# Patient Record
Sex: Female | Born: 1980 | Race: Black or African American | Hispanic: No | Marital: Married | Smoking: Current every day smoker
Health system: Southern US, Community
[De-identification: ages and names within clinical notes are randomized; demographics above are authoritative.]

## PROBLEM LIST (undated history)

## (undated) DIAGNOSIS — F329 Major depressive disorder, single episode, unspecified: Secondary | ICD-10-CM

## (undated) DIAGNOSIS — F419 Anxiety disorder, unspecified: Secondary | ICD-10-CM

## (undated) DIAGNOSIS — D649 Anemia, unspecified: Secondary | ICD-10-CM

## (undated) DIAGNOSIS — F32A Depression, unspecified: Secondary | ICD-10-CM

## (undated) DIAGNOSIS — M1990 Unspecified osteoarthritis, unspecified site: Secondary | ICD-10-CM

## (undated) HISTORY — PX: TUBAL LIGATION: SHX77

---

## 2004-04-08 ENCOUNTER — Emergency Department (HOSPITAL_COMMUNITY): Admission: EM | Admit: 2004-04-08 | Discharge: 2004-04-08 | Payer: Self-pay | Admitting: Emergency Medicine

## 2005-11-22 ENCOUNTER — Emergency Department (HOSPITAL_COMMUNITY): Admission: EM | Admit: 2005-11-22 | Discharge: 2005-11-22 | Payer: Self-pay | Admitting: Emergency Medicine

## 2005-11-29 ENCOUNTER — Inpatient Hospital Stay (HOSPITAL_COMMUNITY): Admission: EM | Admit: 2005-11-29 | Discharge: 2005-11-30 | Payer: Self-pay | Admitting: Emergency Medicine

## 2006-01-04 ENCOUNTER — Ambulatory Visit (HOSPITAL_COMMUNITY): Payer: Self-pay | Admitting: Psychiatry

## 2006-01-16 ENCOUNTER — Ambulatory Visit (HOSPITAL_COMMUNITY): Payer: Self-pay | Admitting: Psychiatry

## 2006-02-09 ENCOUNTER — Emergency Department (HOSPITAL_COMMUNITY): Admission: EM | Admit: 2006-02-09 | Discharge: 2006-02-09 | Payer: Self-pay | Admitting: Emergency Medicine

## 2006-02-18 ENCOUNTER — Ambulatory Visit (HOSPITAL_COMMUNITY): Payer: Self-pay | Admitting: Psychiatry

## 2006-03-20 ENCOUNTER — Ambulatory Visit (HOSPITAL_COMMUNITY): Payer: Self-pay | Admitting: Psychiatry

## 2006-04-17 ENCOUNTER — Ambulatory Visit (HOSPITAL_COMMUNITY): Admission: RE | Admit: 2006-04-17 | Discharge: 2006-04-17 | Payer: Self-pay | Admitting: Family Medicine

## 2006-05-01 ENCOUNTER — Emergency Department (HOSPITAL_COMMUNITY): Admission: EM | Admit: 2006-05-01 | Discharge: 2006-05-01 | Payer: Self-pay | Admitting: Emergency Medicine

## 2006-05-02 ENCOUNTER — Inpatient Hospital Stay (HOSPITAL_COMMUNITY): Admission: AD | Admit: 2006-05-02 | Discharge: 2006-05-02 | Payer: Self-pay | Admitting: Family Medicine

## 2006-05-02 ENCOUNTER — Ambulatory Visit: Payer: Self-pay | Admitting: *Deleted

## 2006-05-06 ENCOUNTER — Inpatient Hospital Stay (HOSPITAL_COMMUNITY): Admission: AD | Admit: 2006-05-06 | Discharge: 2006-05-06 | Payer: Self-pay | Admitting: Obstetrics & Gynecology

## 2006-05-06 ENCOUNTER — Ambulatory Visit: Payer: Self-pay | Admitting: Obstetrics and Gynecology

## 2006-05-15 ENCOUNTER — Ambulatory Visit (HOSPITAL_COMMUNITY): Payer: Self-pay | Admitting: Psychiatry

## 2006-06-10 ENCOUNTER — Ambulatory Visit (HOSPITAL_COMMUNITY): Payer: Self-pay | Admitting: Psychiatry

## 2006-07-24 ENCOUNTER — Ambulatory Visit (HOSPITAL_COMMUNITY): Payer: Self-pay | Admitting: Psychiatry

## 2006-08-28 ENCOUNTER — Ambulatory Visit (HOSPITAL_COMMUNITY): Payer: Self-pay | Admitting: Psychiatry

## 2006-09-03 ENCOUNTER — Inpatient Hospital Stay (HOSPITAL_COMMUNITY): Admission: AD | Admit: 2006-09-03 | Discharge: 2006-09-05 | Payer: Self-pay | Admitting: Obstetrics & Gynecology

## 2006-09-03 ENCOUNTER — Ambulatory Visit: Payer: Self-pay | Admitting: Obstetrics & Gynecology

## 2006-09-09 ENCOUNTER — Inpatient Hospital Stay (HOSPITAL_COMMUNITY): Admission: AD | Admit: 2006-09-09 | Discharge: 2006-09-09 | Payer: Self-pay | Admitting: Gynecology

## 2006-11-01 ENCOUNTER — Ambulatory Visit (HOSPITAL_COMMUNITY): Payer: Self-pay | Admitting: Psychiatry

## 2006-11-27 ENCOUNTER — Ambulatory Visit (HOSPITAL_COMMUNITY): Payer: Self-pay | Admitting: Psychiatry

## 2008-08-28 ENCOUNTER — Emergency Department (HOSPITAL_COMMUNITY): Admission: EM | Admit: 2008-08-28 | Discharge: 2008-08-28 | Payer: Self-pay | Admitting: Emergency Medicine

## 2009-12-20 ENCOUNTER — Emergency Department (HOSPITAL_COMMUNITY): Admission: EM | Admit: 2009-12-20 | Discharge: 2009-12-20 | Payer: Self-pay | Admitting: Emergency Medicine

## 2009-12-24 ENCOUNTER — Emergency Department (HOSPITAL_COMMUNITY): Admission: EM | Admit: 2009-12-24 | Discharge: 2009-12-24 | Payer: Self-pay | Admitting: Emergency Medicine

## 2009-12-30 ENCOUNTER — Emergency Department (HOSPITAL_COMMUNITY): Admission: EM | Admit: 2009-12-30 | Discharge: 2009-12-30 | Payer: Self-pay | Admitting: Family Medicine

## 2010-01-08 ENCOUNTER — Emergency Department (HOSPITAL_COMMUNITY)
Admission: EM | Admit: 2010-01-08 | Discharge: 2010-01-09 | Payer: Self-pay | Source: Home / Self Care | Admitting: Emergency Medicine

## 2010-04-18 LAB — CBC
Hemoglobin: 14.1 g/dL (ref 12.0–15.0)
MCH: 34.6 pg — ABNORMAL HIGH (ref 26.0–34.0)
MCV: 98.8 fL (ref 78.0–100.0)
RBC: 4.08 MIL/uL (ref 3.87–5.11)

## 2010-04-18 LAB — BASIC METABOLIC PANEL
CO2: 26 mEq/L (ref 19–32)
Calcium: 9 mg/dL (ref 8.4–10.5)
Chloride: 102 mEq/L (ref 96–112)
GFR calc Af Amer: >60 mL/min (ref >60)
Sodium: 139 mEq/L (ref 135–145)

## 2010-04-18 LAB — RAPID URINE DRUG SCREEN, HOSP PERFORMED
Barbiturates: NOT DETECTED
Opiates: NOT DETECTED

## 2010-04-18 LAB — DIFFERENTIAL
Eosinophils Absolute: 0.1 10*3/uL (ref 0.0–0.7)
Lymphocytes Relative: 12 % (ref 12–46)
Lymphs Abs: 1.4 10*3/uL (ref 0.7–4.0)
Monocytes Relative: 7 % (ref 3–12)
Neutrophils Relative %: 81 % — ABNORMAL HIGH (ref 43–77)

## 2010-05-26 ENCOUNTER — Inpatient Hospital Stay (HOSPITAL_COMMUNITY)
Admission: AD | Admit: 2010-05-26 | Discharge: 2010-05-26 | Discharge status: Against Medical Advice | Payer: Self-pay | Source: Ambulatory Visit | Attending: Obstetrics and Gynecology | Admitting: Obstetrics and Gynecology

## 2010-05-26 DIAGNOSIS — R109 Unspecified abdominal pain: Secondary | ICD-10-CM | POA: Insufficient documentation

## 2010-05-26 LAB — URINALYSIS, ROUTINE W REFLEX MICROSCOPIC
Glucose, UA: NEGATIVE mg/dL
Ketones, ur: NEGATIVE mg/dL
Leukocytes, UA: NEGATIVE
Specific Gravity, Urine: 1.01 (ref 1.005–1.030)
pH: 5 (ref 5.0–8.0)

## 2010-05-26 LAB — URINE MICROSCOPIC-ADD ON

## 2010-06-20 NOTE — Op Note (Signed)
[name], [name]           ACCOUNT NO.:  [id number]   MEDICAL RECORD NO.:  [id number]          PATIENT TYPE:  INP   LOCATION:  9120                          FACILITY:  [facility]   PHYSICIAN:  Allie Bossier, MD        DATE OF BIRTH:  06-Feb-1980   DATE OF PROCEDURE:  09/04/2006  DATE OF DISCHARGE:                               OPERATIVE REPORT   PREOPERATIVE DIAGNOSES:  Multiparity, desires sterility.   POSTOPERATIVE DIAGNOSES:  Multiparity, desires sterility.   PROCEDURE:  Application of Filshie clips for sterilization procedure.   SURGEON:  Allie Bossier, M.D.   ANESTHESIA:  Spinal, Raul Del, M.D.   COMPLICATIONS:  None.   ESTIMATED BLOOD LOSS:  Minimal.   SPECIMENS:  None.   FINDINGS:  Normal-appearing oviducts.   DETAILED PROCEDURE AND FINDINGS:  The risks, benefits, and alternatives  of surgery were explained, understood and accepted.  She understands and  was able to verbalize a 1% failure rate of this procedure and declined  alternative forms of birth control.  Her epidural was bolused for  surgery.  She was taken to the operating room and her abdomen was  prepped and draped in the usual sterile fashion.  After adequate  anesthesia was assured, approximately 10 mL of 0.5% Marcaine was  injected into the umbilical area.  A vertical incision was made at this  site.  The peritoneum was entered with hemostats.  Using [employer]  retractors, the oviduct on the patient's left was visualized.  It was  grasped with a Babcock clamp and traced to the fimbriated end.  It was  retraced to the isthmic region, where a Filshie clip was placed.  Approximately 1 mL of 0.5% Marcaine was placed next to the clip in the  tube for postoperative pain management.  Excellent hemostasis was noted.  A repeat procedure was performed on the left.  Again, the isthmic region  was ligated with Filshie clip.   The fascia was elevated and closed with a 0 Vicryl running, nonlocking  suture.   Subcuticular closure was done with 4-0 Vicryl.  Instrument,  sponge and needle counts were correct.  She was taken to the recovery  room in stable condition.     Allie Bossier, MD  Electronically Signed    MCD/MEDQ  D:  09/04/2006  T:  09/04/2006  Job:  [id number]

## 2010-06-23 NOTE — Consult Note (Signed)
[name], [name] NO.:  [id number]   MEDICAL RECORD NO.:  [id number]          PATIENT TYPE:  INP   LOCATION:  2008                         FACILITY:  [facility]   PHYSICIAN:  Antonietta Breach, M.D.  DATE OF BIRTH:  1981-01-15   DATE OF CONSULTATION:  11/30/2005  DATE OF DISCHARGE:  11/30/2005                                   CONSULTATION   REQUESTING PHYSICIAN:  Hillery Aldo, M.D.   REASON FOR CONSULTATION:  Psychosis, confusion, and dissociation.   [name] is a 30 year old female admitted to the [facility]  on November 29, 2005, after being found out in the rain with bizarre  behavior.  The patient was having slight auditory hallucinations and had  been believing that someone was following her and stalking her.  She had  lost a recollection of who her close relatives were and a recollection for  recent days.  The patient had recently gotten some distressing news about  her brother being murdered.  She had been required to testify in court.  The  patient drinks a 24-ounce beer daily.  She states she does not normally  finish it.  She smokes marijuana on a regular basis and she states there is  a possibility something was put in it.  She provides this information after  recovering from her amnestic dissociative symptoms.   The patient also is doing much better today with her orientation.  She has  no further hallucinations, delusions.  She has no thoughts of harming  herself or others.  She has normal interests and looks forward to getting  back home.  She is cooperative with staff.   PAST PSYCHIATRIC HISTORY:  The patient denies any prior history of  hallucinations or delusions.  She denies any history of amnestic periods.  She denies any history of psychiatric care, suicidal hospitalizations or  suicide attempts.  She has never been on any psychotropic medication.   FAMILY PSYCHIATRIC HISTORY:  She does not know of any.   SOCIAL HISTORY:   The patient is married.  She has 2 children.  She works at  [employer].  She resides with her husband.  She notes that her husband has  been unemployed recently and this has also been a stress.   SUBSTANCE ABUSE:  As above.   GENERAL MEDICAL PROBLEMS:  The patient has no general medical problems.   MEDICATIONS:  The MAR is reviewed.  1. The patient is placed on Zyprexa through recommendation by the      undersigned yesterday at 2.5 mg b.i.d. and 5 mg every day p.r.n.  2. She is on Ativan 1 mg t.i.d. and 1-2 mg q.6 h. p.r.n.   LABORATORY DATA:  The MCV is elevated at 105.5.  The platelet count is  normal.  BUN normal.  Creatinine normal.  The pregnancy test is negative.  The urine drug screen was positive for THC and benzodiazepines.  Blood  alcohol negative.  Head CT was negative.   THE PATIENT HAS NO KNOWN DRUG ALLERGIES.   REVIEW OF SYSTEMS:  CONSTITUTIONAL:  Afebrile.  EYES:  No visual  changes.  EARS:  No hearing impairment.  NOSE:  No rhinorrhea.  THROAT:  no sore  throat.  MOUTH:  Unremarkable.  NEUROLOGIC:  Unremarkable.  PSYCHIATRIC:  As  above.  CARDIOVASCULAR:  No chest pain, palpitations, or edema.  RESPIRATORY:  No coughing or wheezing.  GASTROINTESTINAL:  No nausea,  vomiting, or diarrhea.  GENITOURINARY:  No dysuria.  SKIN:  Unremarkable.  MUSCULOSKELETAL:  No deformities.  ENDOCRINE/METABOLIC:  Unremarkable.  HEMATOLOGIC/LYMPHATIC:  As above.   PHYSICAL EXAMINATION:  VITAL SIGNS:  Temperature 98, pulse 92, respirations  18, blood pressure 105/71, O2 saturation on room air 100%.  MENTAL STATUS:  [name] is alert.  She is oriented to all spheres.  She  is socially appropriate.  Her mood is slightly anxious.  Her affect is  mildly constricted at baseline with a broad appropriate response.  She has  intact memory for immediate recent and remote.  She does have recall also  for most of the period in which she was having the dissociative symptoms  yesterday.  She has  good insight into her psychotic symptoms and can clearly  recognize that she was hallucinating, that she was having a bizarre  experience similar to like a nightmare while awake.  She also mentions that  there is a possibility that something was placed in her marijuana, although  she states that it looked like the normal type that she uses.  She does  recognize that her substance use pattern can contribute to her dysfunction.  Thought process is logical, coherent and goal-directed.  No looseness of  association thought content.  No thoughts of harming herself, harming  others.  No delusions.  No hallucinations.  Her speech involves normal rate  and prosody.  Her eye contact is good.  Her psychomotor tone is within  normal limits.  Her judgment is now intact.  Her fund of knowledge and  intelligence are average.   DIAGNOSES:   AXIS I:  1. 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  2. Dissociative disorder, not otherwise specified, currently resolved.  3. Cannabis abuse, rule out alcohol abuse.   AXIS II:  Deferred.   AXIS III:  See general medical problems.   AXIS IV:  Primary support group.   AXIS V:  Fifty-five.   [name] is no longer at risk for self neglect.  She has recovered from  an acute psychotic as well as acute dissociative symptoms.   Because of the recent recovery and the benefits of further observation, the  undersigned recommended that she voluntarily enter a psychiatric inpatient  unit for further observation, however, the patient declined and she no  longer meets criteria for commitment give her stability today which has been  consistent at least for today.   While the patient declines inpatient, she is highly motivated for  outpatient.  She also agrees to the goal of abstinence from alcohol and  illegal drugs.   The patient has knowledge of emergency services contact number and agrees to call if she develops any thoughts of harming  herself, thoughts of harming  others, hallucinations, or distress.   INFORMED CONSENT:  The indications, alternatives, and adverse effects of  Zyprexa were discussed with the patient for anti-psychosis.  The patient  understands and would like to continue Zyprexa with the understanding that  any discussion of reduction in dosage or discontinuation needs to be done  according to the standard of care and under the guidance of her followup  doctor.  She is motivated for the intensive outpatient program at one of the  local hospitals.   The undersigned provided education and ego-supportive therapy.   RECOMMENDATIONS:  1. Would reduce the Ativan to 1 mg b.i.d., and then 1 mg every day the      subsequent day, and then stop.  Would provide Ativan 0.5 mg one p.o.      t.i.d. p.r.n. anxiety with no more than 20 dispensed.  This will      provide enough for the patient to enter the intensive outpatient      program with relief for any feeling on edge that she might have.  2. Would continue the Zyprexa q.h.s. for anti-psychosis.  Would also      provide a 10-day supply on prescription.  The patient will be entering      the intensive outpatient program where further prescribing can be done      in preparation for her eventual outpatient visits.  3. Twelve Step groups are recommended along with the chemical dependency      intensive outpatient program at either [facility], [facility], or [facility].  4. Psychotherapy will be conducted within one of those programs and      eventually individual psychotherapy is also recommended, emphasizing      coping skills and stress management.   DISCUSSION:  The patient's long term prognosis regarding these psychotic  episode is un-determined at this point.  The differential does include those  within the primary psychotic disorders as well as possible substance  induced.      Antonietta Breach, M.D.  Electronically  Signed     JW/MEDQ  D:  11/30/2005  T:  12/01/2005  Job:  [id number]

## 2010-06-23 NOTE — Discharge Summary (Signed)
[name], [name]           ACCOUNT NO.:  [id number]   MEDICAL RECORD NO.:  [id number]          PATIENT TYPE:  INP   LOCATION:  2008                         FACILITY:  [facility]   PHYSICIAN:  Hillery Aldo, M.D.   DATE OF BIRTH:  11-22-1980   DATE OF ADMISSION:  11/28/2005  DATE OF DISCHARGE:  11/30/2005                                 DISCHARGE SUMMARY   PRIMARY CARE PHYSICIAN:  The patient is unassigned.   DISCHARGE DIAGNOSES:  1. Psychosis, not otherwise specified.  2. Depression.  3. Anxiety.  4. Mild rhabdomyolysis.  5. Mild macrocytic anemia, B12 and folate within normal limits.   DISCHARGE MEDICATIONS:  1. Zyprexa 2.5 mg b.i.d. and 5 mg p.r.n.  2. Ativan 1 mg t.i.d.   CONSULTATIONS:  Antonietta Breach, M.D., of psychiatry.   PROCEDURES AND DIAGNOSTIC STUDIES:  CT of the head on November 28, 2005,  showed no intracranial abnormalities.   DISCHARGE LABORATORY DATA:  Sodium was 139, potassium 3.8, chloride 111,  bicarb 25, BUN 6, creatinine 0.6, glucose 91.  White blood cell count was  9.3, hemoglobin 11.1, hematocrit 33.2, platelet count 167.  B12 was 804,  folate 12.6, TSH 0.415.  RPR was nonreactive.  Total CK was 733.   BRIEF ADMISSION HISTORY OF PRESENT ILLNESS:  The patient is a 30 year old  female who was admitted for concerns about abnormal behavior.  Apparently,  the patient was found by her mother agitated and walking outside in the rain  partially dressed.  The patient apparently did not recognize the mother and  screamed uncontrollably and had to be forcibly taken to the emergency  department by EMS.  She was restrained physically.  She was admitted for  further evaluation and workup.   HOSPITAL COURSE BY PROBLEMS:  Problem 1.  PSYCHOTIC EPISODE:  The patient had an acute episode of  psychosis likely triggered by the stress of attending a recent court hearing  in which her brother's murderer was on trial.  According to the patient's  mother, she has had  difficulty in accepting the death of her brother.  She  had no prior past medical history of psychiatric disturbances.  She was  committed to the hospital and then seen in consultation by Dr. Jeanie Sewer and  Eduard Roux, NP. They recommended that the patient be commenced on Zyprexa  b.i.d. with 5 mg doses daily p.r.n.  She was also started on standing dose  of Ativan.  She has not exhibited any psychosis since her arrival on the  unit.  She has no memory or recollection of the events that triggered her  hospitalization.  She is alert and oriented x3.  Urine drug screen was only  positive for marijuana.  There were some benzodiazepines in her urine but  she was given Versed in the emergency department, which likely explains  this.  The patient has been calm and has not exhibited any homicidal or  suicidal behaviors.  She has been monitored by a 24-hour sitter.  She is  medically stable for transfer to a psychiatric facility.  The patient is  requesting to go home at the  present time, and was re- evaluated by Dr.  Jeanie Sewer to determine if she is still committable.  He felt that it was  safe to send her home with outpatient psychiatric follow-up.   Problem 2.  MILD RHABDOMYOLYSIS:  The patient had mild rhabdomyolysis as  evidenced by CK of 1458 on arrival.  With IV fluids, this came down to 733.  The patient is mildly symptomatic with some sore muscles in the shoulder  area, otherwise no complaints.   Problem 3.  MILD MACROCYTIC ANEMIA:  The patient does have a very mild  anemia.  Her B12 level and serum folate are within normal limits.   DISPOSITION:  The patient will be discharged home. She was committed  yesterday when she was acutely psychotic; however, she is in a normal state  of mind at present.  She will follow up at the mental health center.      Hillery Aldo, M.D.  Electronically Signed     CR/MEDQ  D:  11/30/2005  T:  11/30/2005  Job:  [id number]

## 2010-06-23 NOTE — H&P (Signed)
[name], [name]           ACCOUNT NO.:  [id number]   MEDICAL RECORD NO.:  [id number]          PATIENT TYPE:  INP   LOCATION:  2008                         FACILITY:  [facility]   PHYSICIAN:  Della Goo, M.D. DATE OF BIRTH:  1980/09/14   DATE OF ADMISSION:  11/28/2005  DATE OF DISCHARGE:                                HISTORY & PHYSICAL   CHIEF COMPLAINT:  Psychotic episode.   HISTORY OF PRESENT ILLNESS:  This is a 30 year old female who was brought to  the emergency room via EMS secondary to her family describing her as being  outside of her home today, in the rain, barking and yelling.  Patient was  reported as being out of control, such that the family called emergency  medical services and family members also took out involuntary commitment  papers.   Patient was evaluated in the emergency department by the ER physician and  behavioral health was consulted for evaluation.  Patient was medicated in  the emergency department, became more calm, however, was referred for  admission secondary to abnormal lab findings of an elevated CPK level.   PAST MEDICAL HISTORY:  The patient is a gravida 2, para 2.  Has no other  medical problems and is on no medications.  Has no known drug allergies.   SOCIAL HISTORY:  Patient denies tobacco usage, reports occasional alcohol,  denies drug usage.   FAMILY HISTORY:  Is non contributory.   REVIEW OF SYSTEMS:  Negative except for abdominal pain and other pertinent's  mentioned above.  Patient has had no fevers, chills, shortness of breath, or  chest pain.   PHYSICAL EXAMINATION:  30 year old well nourished, well developed female in  discomfort but no acute distress.  Her vital signs:  Temperature of 99.0,  blood pressure 107/71, heart rate 73, respirations 20, O2 saturation 100%.  HEENT:  Normocephalic, atraumatic.  No scleral icterus.  Pupils equally  round, reactive to light.  Extraocular muscles are intact.  Oropharynx is   clear.  NECK:  Is supple, full range of motion.  No thyromegaly, adenopathy, or  jugular venous distension.  CARDIOVASCULAR:  Regular rate, rhythm.  LUNGS:  Clear to auscultation bilaterally.  ABDOMEN:  Positive bowel sounds, soft, non tender.  EXTREMITIES:  No edema.  SKIN EXAMINATION:  No visible ecchymotic areas.  NEUROLOGIC EXAMINATION:  Patient is alert, mildly confused, speech is clear,  responses are appropriate at this time.  There are no focal deficits.   LABORATORY DATA:  Laboratory studies reveal a hemoglobin of 15.6, hematocrit  46.0, sodium 138, potassium 3.3, chloride 103, bi carb 26.4, BUN 9,  creatinine 1.1, glucose 66, creatinine kinase 1871, alcohol level less than  5.  Urine pregnancy negative.  Urine drug screen positive benzodiazepine,  positive for THC.  Repeated creatinine kinase 1740.  CT of the head  revealing no acute mass or hemorrhage.   ASSESSMENT:  30 year old female with an acute psychotic episode evaluated in  the emergency department and found to have a primary diagnosis of:  1. Acute psychotic episode.  2. Elevated CPK levels.  3. Mild hypokalemia, potassium of 3.3.  4.  Abdominal pain.  5. Substance abuse.   PLAN:  Patient will be admitted to a telemetry area for cardiac monitoring.  A urine myoglobin will be ordered.  Patient will be placed on IV fluids for  hydration in case this is muscle breakdown from rhabdomyolysis versus  contusions/muscle injury.  Patient will also be placed on nitrates, oxygen,  aspirin therapy, and Lovenox therapy.  Patient will also be placed on p.r.n.  Ativan and morphine for now.      Della Goo, M.D.  Electronically Signed     HJ/MEDQ  D:  11/29/2005  T:  11/29/2005  Job:  [id number]

## 2010-11-20 LAB — URINALYSIS, ROUTINE W REFLEX MICROSCOPIC
Glucose, UA: NEGATIVE
Ketones, ur: NEGATIVE
Leukocytes, UA: NEGATIVE
Nitrite: NEGATIVE
Protein, ur: NEGATIVE
Urobilinogen, UA: 0.2

## 2010-11-20 LAB — CBC
MCHC: 33.3
MCV: 99.5
Platelets: 301
Platelets: 348
RDW: 13.1
RDW: 13.2
WBC: 14.1 — ABNORMAL HIGH

## 2010-11-20 LAB — RPR: RPR Ser Ql: NONREACTIVE

## 2010-11-20 LAB — URINE MICROSCOPIC-ADD ON

## 2011-08-30 ENCOUNTER — Emergency Department (HOSPITAL_COMMUNITY)
Admission: EM | Admit: 2011-08-30 | Discharge: 2011-08-30 | Disposition: A | Discharge status: Home / Self Care | Payer: Self-pay | Attending: Emergency Medicine | Admitting: Emergency Medicine

## 2011-08-30 ENCOUNTER — Encounter (HOSPITAL_COMMUNITY): Payer: Self-pay | Admitting: *Deleted

## 2011-08-30 DIAGNOSIS — B9689 Other specified bacterial agents as the cause of diseases classified elsewhere: Secondary | ICD-10-CM | POA: Insufficient documentation

## 2011-08-30 DIAGNOSIS — A499 Bacterial infection, unspecified: Secondary | ICD-10-CM | POA: Insufficient documentation

## 2011-08-30 DIAGNOSIS — N760 Acute vaginitis: Secondary | ICD-10-CM | POA: Insufficient documentation

## 2011-08-30 DIAGNOSIS — F17200 Nicotine dependence, unspecified, uncomplicated: Secondary | ICD-10-CM | POA: Insufficient documentation

## 2011-08-30 LAB — COMPREHENSIVE METABOLIC PANEL
ALT: 18 U/L (ref 0–35)
AST: 21 U/L (ref 0–37)
Albumin: 3.7 g/dL (ref 3.5–5.2)
Alkaline Phosphatase: 74 U/L (ref 39–117)
Calcium: 8.8 mg/dL (ref 8.4–10.5)
Potassium: 3.8 mEq/L (ref 3.5–5.1)
Sodium: 138 mEq/L (ref 135–145)
Total Protein: 7 g/dL (ref 6.0–8.3)

## 2011-08-30 LAB — CBC WITH DIFFERENTIAL/PLATELET
Basophils Absolute: 0 10*3/uL (ref 0.0–0.1)
Eosinophils Absolute: 0.4 10*3/uL (ref 0.0–0.7)
Eosinophils Relative: 4 % (ref 0–5)
Lymphs Abs: 4.2 10*3/uL — ABNORMAL HIGH (ref 0.7–4.0)
MCH: 34.1 pg — ABNORMAL HIGH (ref 26.0–34.0)
MCV: 100.5 fL — ABNORMAL HIGH (ref 78.0–100.0)
Neutrophils Relative %: 48 % (ref 43–77)
Platelets: 263 10*3/uL (ref 150–400)
RBC: 3.67 MIL/uL — ABNORMAL LOW (ref 3.87–5.11)
RDW: 13.3 % (ref 11.5–15.5)
WBC: 10 10*3/uL (ref 4.0–10.5)

## 2011-08-30 LAB — URINALYSIS, ROUTINE W REFLEX MICROSCOPIC
Bilirubin Urine: NEGATIVE
Ketones, ur: NEGATIVE mg/dL
Leukocytes, UA: NEGATIVE
Nitrite: NEGATIVE
Specific Gravity, Urine: 1.028 (ref 1.005–1.030)
Urobilinogen, UA: 0.2 mg/dL (ref 0.0–1.0)
pH: 6 (ref 5.0–8.0)

## 2011-08-30 LAB — WET PREP, GENITAL: Yeast Wet Prep HPF POC: NONE SEEN

## 2011-08-30 MED ORDER — METRONIDAZOLE 500 MG PO TABS: 500.0000 mg | ORAL_TABLET | Freq: Two times a day (BID) | ORAL | Status: AC

## 2011-08-30 NOTE — ED Provider Notes (Signed)
History     CSN: [id number]  Arrival date & time 08/30/11  4098   First MD Initiated Contact with Patient 08/30/11 2052      Chief Complaint  Patient presents with  . Abdominal Pain     HPI Lower abd pain fo one month with some vaginal irritation  History reviewed. No pertinent past medical history.  History reviewed. No pertinent past surgical history.  No family history on file.  History  Substance Use Topics  . Smoking status: Current Everyday Smoker  . Smokeless tobacco: Not on file  . Alcohol Use: Yes    OB History    Grav Para Term Preterm Abortions TAB SAB Ect Mult Living                  Review of Systems  All other systems reviewed and are negative.    Allergies  Review of patient's allergies indicates no known allergies.  Home Medications   Current Outpatient Rx  Name Route Sig Dispense Refill  . ACETAMINOPHEN 500 MG PO TABS Oral Take 1,000 mg by mouth every 6 (six) hours as needed. For pain    . METRONIDAZOLE 500 MG PO TABS Oral Take 1 tablet (500 mg total) by mouth 2 (two) times daily. 14 tablet 0    BP 120/83  Pulse 83  Temp 98.8 F (37.1 C) (Oral)  Resp 20  SpO2 98%  LMP 07/31/2011  Physical Exam  Nursing note and vitals reviewed. Constitutional: She is oriented to person, place, and time. She appears well-developed. No distress.  HENT:  Head: Normocephalic and atraumatic.  Eyes: Pupils are equal, round, and reactive to light.  Neck: Normal range of motion.  Cardiovascular: Normal rate and intact distal pulses.   Pulmonary/Chest: No respiratory distress.  Abdominal: Normal appearance. She exhibits no distension.  Genitourinary: There is no rash or tenderness on the right labia. There is no rash or tenderness on the left labia. No erythema, tenderness or bleeding around the vagina. Vaginal discharge found.  Musculoskeletal: Normal range of motion.  Neurological: She is alert and oriented to person, place, and time. No cranial nerve  deficit.  Skin: Skin is warm and dry. No rash noted.  Psychiatric: She has a normal mood and affect. Her behavior is normal.    ED Course  Procedures (including critical care time)  Labs Reviewed  CBC WITH DIFFERENTIAL - Abnormal; Notable for the following:    RBC 3.67 (*)     MCV 100.5 (*)     MCH 34.1 (*)     Lymphs Abs 4.2 (*)     All other components within normal limits  WET PREP, GENITAL - Abnormal; Notable for the following:    Clue Cells Wet Prep HPF POC FEW (*)     WBC, Wet Prep HPF POC FEW (*)     All other components within normal limits  URINALYSIS, ROUTINE W REFLEX MICROSCOPIC  PREGNANCY, URINE  COMPREHENSIVE METABOLIC PANEL  GC/CHLAMYDIA PROBE AMP, GENITAL  LAB REPORT - SCANNED   No results found.   1. BV (bacterial vaginosis)       MDM          Nelia Shi, MD 09/02/11 2249

## 2011-08-30 NOTE — ED Notes (Signed)
Pelvic cart at bedside and set up.  

## 2011-08-30 NOTE — ED Notes (Signed)
Assisted Dr. Radford Pax with pelvic exam on pt.

## 2011-08-30 NOTE — ED Notes (Signed)
Lower abd pain fo one month with some vaginal irritation

## 2011-08-31 LAB — GC/CHLAMYDIA PROBE AMP, GENITAL: Chlamydia, DNA Probe: NEGATIVE

## 2014-02-25 ENCOUNTER — Emergency Department (HOSPITAL_COMMUNITY)
Admission: EM | Admit: 2014-02-25 | Discharge: 2014-02-25 | Disposition: A | Discharge status: Home / Self Care | Payer: Medicaid Other | Attending: Emergency Medicine | Admitting: Emergency Medicine

## 2014-02-25 ENCOUNTER — Encounter (HOSPITAL_COMMUNITY): Payer: Self-pay | Admitting: Neurology

## 2014-02-25 DIAGNOSIS — H01001 Unspecified blepharitis right upper eyelid: Secondary | ICD-10-CM | POA: Diagnosis not present

## 2014-02-25 DIAGNOSIS — H01003 Unspecified blepharitis right eye, unspecified eyelid: Secondary | ICD-10-CM

## 2014-02-25 DIAGNOSIS — Z720 Tobacco use: Secondary | ICD-10-CM | POA: Insufficient documentation

## 2014-02-25 DIAGNOSIS — H578 Other specified disorders of eye and adnexa: Secondary | ICD-10-CM | POA: Diagnosis present

## 2014-02-25 MED ORDER — NAPROXEN 500 MG PO TABS: 500.0000 mg | ORAL_TABLET | Freq: Two times a day (BID) | ORAL | Status: DC

## 2014-02-25 MED ORDER — NAPROXEN 250 MG PO TABS
500.0000 mg | ORAL_TABLET | Freq: Once | ORAL | Status: AC
  Administered 2014-02-25: 500 mg via ORAL
  Filled 2014-02-25: qty 2

## 2014-02-25 NOTE — ED Notes (Signed)
Pt reports swelling to right eye lid for several days. Reports bump under eye lid.

## 2014-02-25 NOTE — ED Provider Notes (Signed)
CSN: [id number]     Arrival date & time 02/25/14  1139 History  This chart was scribed for non-physician practitioner, Harle BattiestElizabeth Sabine Tenenbaum, NP-C, working with Lyanne CoKevin M Campos, MD by Charline BillsEssence Howell, ED Scribe. This patient was seen in room TR04C/TR04C and the patient's care was started at 12:09 PM.   Chief Complaint  Patient presents with  . Eye Problem   The history is provided by the patient. No language interpreter was used.   HPI Comments: [name] is a 34 y.o. female who presents to the Emergency Department complaining of gradually worsening swelling to R eyelid over the past 3 days. Pt states that she noted a bump under R eyelid yesterday and drainage last night. She reports associated eyelid pain that is exacerbated with touching. Pt currently rates her pain 5/10 and describes pain as a stinging sensation. She denies eye pain. Pt has been treating with warm compresses with mild relief.   History reviewed. No pertinent past medical history. History reviewed. No pertinent past surgical history. No family history on file. History  Substance Use Topics  . Smoking status: Current Every Day Smoker  . Smokeless tobacco: Not on file  . Alcohol Use: Yes   OB History    No data available     Review of Systems  HENT: Positive for facial swelling.   Eyes: Negative for pain.  All other systems reviewed and are negative.  Allergies  Review of patient's allergies indicates no known allergies.  Home Medications   Prior to Admission medications   Medication Sig Start Date End Date Taking? Authorizing Provider  acetaminophen (TYLENOL) 500 MG tablet Take 1,000 mg by mouth every 6 (six) hours as needed. For pain    Historical Provider, MD   Triage Vitals: BP 128/95 mmHg  Pulse 116  Temp(Src) 98.9 F (37.2 C) (Oral)  Resp 20  SpO2 100%  LMP 02/07/2014 Physical Exam  Constitutional: She is oriented to person, place, and time. She appears well-developed and well-nourished. No  distress.  HENT:  Head: Normocephalic and atraumatic.  Eyes: Conjunctivae and EOM are normal.  Redness and swelling to R upper lid.  Neck: Neck supple.  Cardiovascular: Normal rate.   Pulmonary/Chest: Effort normal.  Musculoskeletal: Normal range of motion.  Neurological: She is alert and oriented to person, place, and time.  Skin: Skin is warm and dry.  Psychiatric: She has a normal mood and affect. Her behavior is normal.  Nursing note and vitals reviewed.  ED Course  Procedures (including critical care time) DIAGNOSTIC STUDIES: Oxygen Saturation is 100% on RA, normal by my interpretation.    COORDINATION OF CARE: 12:13 PM-Discussed treatment plan which includes Naproxen and warm compresses with pt at bedside and pt agreed to plan.   Labs Review Labs Reviewed - No data to display  Imaging Review No results found.   EKG Interpretation None      MDM   Final diagnoses:  Blepharitis of eyelid of right eye   34 yo with redness, inflammation and tenderness to rt upper eyelid, small amount of drainage noted also.  Discussed treatments include warm compresses for 5-10 minutes, several times a day, and diluted baby shampoo on a q-tip to the eye lid.  If symptoms do not improve in 2-3 days, return to the ED for possible treatment with abx ointment.  Pt's vision is unaffected and her eye is not painful and EOMs intact. Pt is well-appearing, in no acute distress and vital signs reviewed and not concerning.  She appears safe to be discharged.  Discharge include follow-up with their PCP.  Return precautions provided. Pt aware of plan and in agreement.  I personally performed the services described in this documentation, which was scribed in my presence. The recorded information has been reviewed and is accurate.  Filed Vitals:   02/25/14 1145 02/25/14 1229  BP: 128/95 117/71  Pulse: 116 93  Temp: 98.9 F (37.2 C)   TempSrc: Oral   Resp: 20   SpO2: 100% 100%   Meds given in  ED:  Medications  naproxen (NAPROSYN) tablet 500 mg (500 mg Oral Given 02/25/14 1227)    Discharge Medication List as of 02/25/2014 12:31 PM       Harle Battiest, NP 02/26/14 1610  Lyanne Co, MD 02/26/14 1555

## 2014-02-25 NOTE — Discharge Instructions (Signed)
These follow the directions provided. Be sure to follow-up with your primary care doctor for further management.  Warm compresses for 5-10 minutes 3-4 times a day to the affected eyelid. You may also use diluted baby shampoo on a q-tip  On the affected eyelid.  Take The naproxen twice a day for discomfort and inflammation. Don't hesitate to return for any new, worsening, or concerning symptoms.  SEEK IMMEDIATE MEDICAL CARE IF:  You have pain, redness, or swelling that gets worse or spreads to other parts of your face.  Your vision changes, or you have pain when looking at lights or moving objects.  You have a fever.  Your symptoms continue for longer than 2 to 4 days or become worse.

## 2014-06-07 ENCOUNTER — Ambulatory Visit: Payer: Medicaid Other | Admitting: Podiatry

## 2014-06-25 ENCOUNTER — Ambulatory Visit: Payer: Medicaid Other | Admitting: Podiatry

## 2014-07-23 ENCOUNTER — Encounter: Payer: Self-pay | Admitting: Podiatry

## 2014-07-23 ENCOUNTER — Ambulatory Visit (INDEPENDENT_AMBULATORY_CARE_PROVIDER_SITE_OTHER): Payer: Medicaid Other | Admitting: Podiatry

## 2014-07-23 VITALS — BP 121/70 | HR 99 | Resp 12

## 2014-07-23 DIAGNOSIS — B351 Tinea unguium: Secondary | ICD-10-CM | POA: Diagnosis not present

## 2014-07-23 DIAGNOSIS — B353 Tinea pedis: Secondary | ICD-10-CM | POA: Diagnosis not present

## 2014-07-23 MED ORDER — CICLOPIROX 8 % EX SOLN: Freq: Every day | CUTANEOUS | Status: DC

## 2014-07-23 MED ORDER — KETOCONAZOLE 2 % EX CREA: 1.0000 "application " | TOPICAL_CREAM | Freq: Every day | CUTANEOUS | Status: DC

## 2014-07-23 NOTE — Patient Instructions (Signed)

## 2014-07-23 NOTE — Progress Notes (Signed)
   Subjective:    Patient ID: [name], female    DOB: Jan 21, 1981, 34 y.o.   MRN: [id number]  HPI 34 year old female presents the office today with concerns of left fifth toe discoloration and thickening on the toenail is also dry, scaly skin on the bottom of her feet which itches. She states that she has tried over-the-counter treatments for both these issues without any resolution. His been ongoing for approximately one year. Denies any drainage or redness from the nail sites. Denies any pain. No other complaints at this time.  Review of Systems  Skin: Positive for color change.  Allergic/Immunologic: Positive for environmental allergies.       Objective:   Physical Exam AAO x3, NAD DP/PT pulses palpable bilaterally, CRT less than 3 seconds Protective sensation intact with Simms Weinstein monofilament, vibratory sensation intact, Achilles tendon reflex intact Left fifth digit nails hypertrophic, dystrophic, discolored, brittle. There is no tenderness to palpation along the nail is no surrounding erythema or drainage. Remaining nails without pathology. On the plantar aspect of the feet is dry, scaly skin with a slight erythematous base consistent with tinea pedis. Subjectively the area does itch. No areas of tenderness to bilateral lower extremities. MMT 5/5, ROM WNL.  No open lesions or pre-ulcerative lesions.  No overlying edema, erythema, increase in warmth to bilateral lower extremities.  No pain with calf compression, swelling, warmth, erythema bilaterally.     Assessment & Plan:  34 year old female left fifth digit likely onychomycosis, tinea pedis -Treatment options discussed including all alternatives, risks, and complications -Rx penlac and ketoconazole --Follow-up 6 months or sooner if any problems arise or if symptoms are not making improvements in the next 6 weeks. In the meantime, encouraged to call the office with any questions, concerns, change in symptoms.

## 2014-08-19 ENCOUNTER — Telehealth: Payer: Self-pay | Admitting: *Deleted

## 2014-08-19 MED ORDER — CICLOPIROX 8 % EX SOLN
Freq: Every day | CUTANEOUS | Status: DC
Start: 2014-08-19 — End: 2014-08-25

## 2014-08-19 NOTE — Telephone Encounter (Addendum)
[facility] faxed refill request for Penlac generic.  Dr. Ardelle AntonWagoner ordered refill as previously for 1 year.  Faxed.

## 2014-08-25 ENCOUNTER — Telehealth: Payer: Self-pay | Admitting: *Deleted

## 2014-08-25 ENCOUNTER — Other Ambulatory Visit: Payer: Self-pay | Admitting: Podiatry

## 2014-08-25 NOTE — Telephone Encounter (Signed)
[phone] with 11 refills.

## 2014-08-25 NOTE — Telephone Encounter (Signed)
Pt states the Penlac generic has not been called to the pharmacy.  Orders called to pt and pharmacy.

## 2015-01-21 ENCOUNTER — Ambulatory Visit: Payer: Medicaid Other | Admitting: Podiatry

## 2015-03-07 ENCOUNTER — Encounter: Payer: Self-pay | Admitting: Podiatry

## 2015-03-07 ENCOUNTER — Ambulatory Visit (INDEPENDENT_AMBULATORY_CARE_PROVIDER_SITE_OTHER): Payer: Medicaid Other | Admitting: Podiatry

## 2015-03-07 VITALS — BP 146/91 | HR 100 | Resp 18

## 2015-03-07 DIAGNOSIS — L819 Disorder of pigmentation, unspecified: Secondary | ICD-10-CM

## 2015-03-07 DIAGNOSIS — B351 Tinea unguium: Secondary | ICD-10-CM | POA: Diagnosis not present

## 2015-03-08 NOTE — Progress Notes (Signed)
Patient ID: [name], female   DOB: Jan 08, 1981, 35 y.o.   MRN: [id number]  Subjective: 35 year old female presents the office today for follow-up evaluation left fifth toe onychodystrophy, likely onychomycosis as well as tinea pedis. She states the skin condition is much better her toe is improving. She discontinued the Penlac daily. She also states that she has a lesion to the back of her left heel that is dark. No pain to the area and denies any redness or drainage. Denies any systemic complaints such as fevers, chills, nausea, vomiting. No acute changes since last appointment, and no other complaints at this time.   Objective: AAO x3, NAD DP/PT pulses palpable bilaterally, CRT less than 3 seconds Protective sensation intact with Dorann Ou monofilament Left fifth digit toenail appears to be somewhat dystrophic, discolored however does appear to be improved. There is no tenderness palpation there is no surrounding edema, erythema, increase in warmth or drainage. No tenderness the remaining toenails. In the posterior medial aspect of the heels hyperkeratotic lesion which appears to be hyperpigmented as well. Area was debrided and this was sent for biopsy. However this does appear to be a callus. The underlying skin appears to be intact. The color is uniform there is no extension into the skin around the area. No edema, erythema, increase in warmth or drainage or any pain. No areas of pinpoint bony tenderness or pain with vibratory sensation. MMT 5/5, ROM WNL. No edema, erythema, increase in warmth to bilateral lower extremities.  No open lesions or pre-ulcerative lesions.  No pain with calf compression, swelling, warmth, erythema  Assessment: Lesion left posterior medial left heel; onychomycosis  Plan: -All treatment options discussed with the patient including all alternatives, risks, complications.  -Continue Penlac -Areas debrided on the left heel and this was sent for  biopsy -Follow-up after biopsy results. -Moisturizer daily -Patient encouraged to call the office with any questions, concerns, change in symptoms.   Ovid Curd, DPM

## 2015-03-10 NOTE — Addendum Note (Signed)
Addended by: Hadley Pen R on: 03/10/2015 08:19 AM   Modules accepted: Orders

## 2016-03-17 ENCOUNTER — Encounter (HOSPITAL_COMMUNITY): Payer: Self-pay | Admitting: Emergency Medicine

## 2016-03-17 ENCOUNTER — Emergency Department (HOSPITAL_COMMUNITY)
Admission: EM | Admit: 2016-03-17 | Discharge: 2016-03-17 | Disposition: A | Discharge status: Home / Self Care | Payer: Medicaid Other | Attending: Emergency Medicine | Admitting: Emergency Medicine

## 2016-03-17 DIAGNOSIS — M5441 Lumbago with sciatica, right side: Secondary | ICD-10-CM | POA: Diagnosis not present

## 2016-03-17 DIAGNOSIS — M545 Low back pain: Secondary | ICD-10-CM | POA: Diagnosis present

## 2016-03-17 DIAGNOSIS — F17200 Nicotine dependence, unspecified, uncomplicated: Secondary | ICD-10-CM | POA: Diagnosis not present

## 2016-03-17 DIAGNOSIS — M5431 Sciatica, right side: Secondary | ICD-10-CM

## 2016-03-17 MED ORDER — HYDROCODONE-ACETAMINOPHEN 5-325 MG PO TABS: 1.0000 | ORAL_TABLET | Freq: Four times a day (QID) | ORAL | 0 refills | Status: DC | PRN

## 2016-03-17 MED ORDER — IBUPROFEN 800 MG PO TABS: 800.0000 mg | ORAL_TABLET | Freq: Three times a day (TID) | ORAL | 0 refills | Status: DC

## 2016-03-17 MED ORDER — DEXAMETHASONE 4 MG PO TABS
10.0000 mg | ORAL_TABLET | Freq: Once | ORAL | Status: AC
  Administered 2016-03-17: 10 mg via ORAL
  Filled 2016-03-17: qty 3

## 2016-03-17 MED ORDER — CYCLOBENZAPRINE HCL 10 MG PO TABS: 5.0000 mg | ORAL_TABLET | Freq: Two times a day (BID) | ORAL | 0 refills | Status: DC | PRN

## 2016-03-17 MED ORDER — KETOROLAC TROMETHAMINE 60 MG/2ML IM SOLN
60.0000 mg | Freq: Once | INTRAMUSCULAR | Status: AC
  Administered 2016-03-17: 60 mg via INTRAMUSCULAR
  Filled 2016-03-17: qty 2

## 2016-03-17 NOTE — ED Provider Notes (Signed)
[facility]-EMERGENCY DEPT Provider Note   CSN: [id number] Arrival date & time: 03/17/16  1411     History   Chief Complaint Chief Complaint  Patient presents with  . Back Pain    HPI [name] is a 36 y.o. female.  The history is provided by the patient. No language interpreter was used.  Back Pain     [name] is a 36 y.o. female who presents to the Emergency Department complaining of back pain.  She reports 2-3 days of right-sided low back pain is located in her SI joint region. The pain radiates to her right hip and thigh. Pain is worse with activity and range of motion. She has no clear injuries but was moving furniture a few days ago. She denies any numbness, weakness, dysuria, hematuria, abdominal pain, fevers, vomiting. She does have a history of sciatica in the past and is not sure if this is similar to prior episodes. History reviewed. No pertinent past medical history.  There are no active problems to display for this patient.   History reviewed. No pertinent surgical history.  OB History    No data available       Home Medications    Prior to Admission medications   Medication Sig Start Date End Date Taking? Authorizing Provider  acetaminophen (TYLENOL) 500 MG tablet Take 1,000 mg by mouth every 6 (six) hours as needed. For pain    Historical Provider, MD  ciclopirox (PENLAC) 8 % solution SEE NOTES 08/25/14   Vivi Barrack, DPM  ketoconazole (NIZORAL) 2 % cream Apply 1 application topically daily. 07/23/14   Vivi Barrack, DPM    Family History No family history on file.  Social History Social History  Substance Use Topics  . Smoking status: Current Every Day Smoker  . Smokeless tobacco: Not on file  . Alcohol use Yes     Allergies   Patient has no known allergies.   Review of Systems Review of Systems  Musculoskeletal: Positive for back pain.  All other systems reviewed and are negative.    Physical Exam Updated Vital  Signs BP 134/84   Pulse 95   Temp 98.4 F (36.9 C) (Oral)   Resp 16   LMP 03/14/2016   SpO2 100%   Physical Exam  Constitutional: She is oriented to person, place, and time. She appears well-developed and well-nourished.  HENT:  Head: Normocephalic and atraumatic.  Cardiovascular: Normal rate and regular rhythm.   No murmur heard. Pulmonary/Chest: Effort normal and breath sounds normal. No respiratory distress.  Abdominal: Soft. There is no tenderness. There is no rebound and no guarding.  Musculoskeletal: She exhibits no edema or tenderness.  2+ DP pulses bilaterally.  Mild tenderness over right SI joint.  Neurological: She is alert and oriented to person, place, and time.  5 out of 5 strength in bilateral lower extremities with sensation of light touch intact in bilateral lower extremities. Positive straight leg raise on the right  Skin: Skin is warm and dry.  Psychiatric: She has a normal mood and affect. Her behavior is normal.  Nursing note and vitals reviewed.    ED Treatments / Results  Labs (all labs ordered are listed, but only abnormal results are displayed) Labs Reviewed - No data to display  EKG  EKG Interpretation None       Radiology No results found.  Procedures Procedures (including critical care time)  Medications Ordered in ED Medications  ketorolac (TORADOL) injection 60 mg (not administered)  dexamethasone (DECADRON) tablet 10 mg (not administered)     Initial Impression / Assessment and Plan / ED Course  I have reviewed the triage vital signs and the nursing notes.  Pertinent labs & imaging results that were available during my care of the patient were reviewed by me and considered in my medical decision making (see chart for details).     Patient here for evaluation of new onset right low back pain that radiates to the hip. She is neurovascularly intact on examination. Clinical picture is consistent with sciatica. There are no red flags  symptoms. Clinical picture is not consistent with epidural abscess, renal colic, UTI, appendicitis, psoas abscess. D/w patient home care for sciatica with outpatient follow up and return precautions.   Final Clinical Impressions(s) / ED Diagnoses   Final diagnoses:  None    New Prescriptions New Prescriptions   No medications on file     Tilden FossaElizabeth Alfonzo Arca, MD 03/17/16 1649

## 2016-03-17 NOTE — ED Triage Notes (Signed)
Pt states "the low part of my back is having really severe pain, bending over makes it worse." Pt hx of sciatica.

## 2016-03-17 NOTE — ED Notes (Signed)
ED Provider at bedside. 

## 2016-07-06 ENCOUNTER — Telehealth: Payer: Self-pay | Admitting: *Deleted

## 2016-07-06 MED ORDER — CICLOPIROX 8 % EX SOLN
CUTANEOUS | 11 refills | Status: DC
Start: 2016-07-06 — End: 2017-03-06

## 2016-07-06 NOTE — Telephone Encounter (Signed)
Pt states she has an question about her last visit. Pt states she has had improvement with the Penlac and would like a refill. Dr. Bary CastillaWagoner okayed refill for Penlac for one year.

## 2017-02-27 ENCOUNTER — Other Ambulatory Visit: Payer: Self-pay | Admitting: Orthopedic Surgery

## 2017-03-18 ENCOUNTER — Inpatient Hospital Stay (HOSPITAL_COMMUNITY): Admission: RE | Admit: 2017-03-18 | Payer: Self-pay | Source: Ambulatory Visit

## 2017-03-18 ENCOUNTER — Inpatient Hospital Stay (HOSPITAL_COMMUNITY): Admission: RE | Admit: 2017-03-18 | Payer: Medicaid Other | Source: Ambulatory Visit

## 2017-03-25 ENCOUNTER — Inpatient Hospital Stay: Admit: 2017-03-25 | Payer: Medicaid Other | Admitting: Orthopedic Surgery

## 2017-03-25 SURGERY — ARTHROPLASTY, HIP, TOTAL, ANTERIOR APPROACH
Anesthesia: Spinal | Laterality: Right

## 2017-10-24 ENCOUNTER — Other Ambulatory Visit: Payer: Self-pay | Admitting: Orthopedic Surgery

## 2017-10-31 ENCOUNTER — Other Ambulatory Visit (HOSPITAL_COMMUNITY): Payer: Self-pay | Admitting: *Deleted

## 2017-10-31 NOTE — Patient Instructions (Addendum)
Belisa Eichholz  10/31/2017   Your procedure is scheduled on: 11-11-17  Report to [facility] Main  Entrance  Report to admitting at 715 AM    Call this number if you have problems the morning of surgery [phone]   Remember: Do not eat food or drink liquids :After Midnight. BRUSH YOUR TEETH MORNING OF SURGERY AND RINSE YOUR MOUTH OUT, NO CHEWING GUM CANDY OR MINTS.     Take these medicines the morning of surgery with A SIP OF WATER: Tramadol if needed                                You may not have any metal on your body including hair pins and              piercings  Do not wear jewelry, make-up, lotions, powders or perfumes, deodorant             Do not wear nail polish.  Do not shave  48 hours prior to surgery.              Men may shave face and neck.   Do not bring valuables to the hospital. [facility] IS NOT             RESPONSIBLE   FOR VALUABLES.  Contacts, dentures or bridgework may not be worn into surgery.  Leave suitcase in the car. After surgery it may be brought to your room.                   Please read over the following fact sheets you were given: _____________________________________________________________________             [facility] - Preparing for Surgery Before surgery, you can play an important role.  Because skin is not sterile, your skin needs to be as free of germs as possible.  You can reduce the number of germs on your skin by washing with CHG (chlorahexidine gluconate) soap before surgery.  CHG is an antiseptic cleaner which kills germs and bonds with the skin to continue killing germs even after washing. Please DO NOT use if you have an allergy to CHG or antibacterial soaps.  If your skin becomes reddened/irritated stop using the CHG and inform your nurse when you arrive at Short Stay. Do not shave (including legs and underarms) for at least 48 hours prior to the first CHG shower.  You may shave your  face/neck. Please follow these instructions carefully:  1.  Shower with CHG Soap the night before surgery and the  morning of Surgery.  2.  If you choose to wash your hair, wash your hair first as usual with your  normal  shampoo.  3.  After you shampoo, rinse your hair and body thoroughly to remove the  shampoo.                           4.  Use CHG as you would any other liquid soap.  You can apply chg directly  to the skin and wash                       Gently with a scrungie or clean washcloth.  5.  Apply the CHG Soap to your body ONLY FROM THE  NECK DOWN.   Do not use on face/ open                           Wound or open sores. Avoid contact with eyes, ears mouth and genitals (private parts).                       Wash face,  Genitals (private parts) with your normal soap.             6.  Wash thoroughly, paying special attention to the area where your surgery  will be performed.  7.  Thoroughly rinse your body with warm water from the neck down.  8.  DO NOT shower/wash with your normal soap after using and rinsing off  the CHG Soap.                9.  Pat yourself dry with a clean towel.            10.  Wear clean pajamas.            11.  Place clean sheets on your bed the night of your first shower and do not  sleep with pets. Day of Surgery : Do not apply any lotions/deodorants the morning of surgery.  Please wear clean clothes to the hospital/surgery center.  FAILURE TO FOLLOW THESE INSTRUCTIONS MAY RESULT IN THE CANCELLATION OF YOUR SURGERY PATIENT SIGNATURE_________________________________  NURSE SIGNATURE__________________________________  ________________________________________________________________________   Adam Phenix  An incentive spirometer is a tool that can help keep your lungs clear and active. This tool measures how well you are filling your lungs with each breath. Taking long deep breaths may help reverse or decrease the chance of developing breathing  (pulmonary) problems (especially infection) following:  A long period of time when you are unable to move or be active. BEFORE THE PROCEDURE   If the spirometer includes an indicator to show your best effort, your nurse or respiratory therapist will set it to a desired goal.  If possible, sit up straight or lean slightly forward. Try not to slouch.  Hold the incentive spirometer in an upright position. INSTRUCTIONS FOR USE  1. Sit on the edge of your bed if possible, or sit up as far as you can in bed or on a chair. 2. Hold the incentive spirometer in an upright position. 3. Breathe out normally. 4. Place the mouthpiece in your mouth and seal your lips tightly around it. 5. Breathe in slowly and as deeply as possible, raising the piston or the ball toward the top of the column. 6. Hold your breath for 3-5 seconds or for as long as possible. Allow the piston or ball to fall to the bottom of the column. 7. Remove the mouthpiece from your mouth and breathe out normally. 8. Rest for a few seconds and repeat Steps 1 through 7 at least 10 times every 1-2 hours when you are awake. Take your time and take a few normal breaths between deep breaths. 9. The spirometer may include an indicator to show your best effort. Use the indicator as a goal to work toward during each repetition. 10. After each set of 10 deep breaths, practice coughing to be sure your lungs are clear. If you have an incision (the cut made at the time of surgery), support your incision when coughing by placing a pillow or rolled up towels firmly against it. Once you are able  to get out of bed, walk around indoors and cough well. You may stop using the incentive spirometer when instructed by your caregiver.  RISKS AND COMPLICATIONS  Take your time so you do not get dizzy or light-headed.  If you are in pain, you may need to take or ask for pain medication before doing incentive spirometry. It is harder to take a deep breath if you  are having pain. AFTER USE  Rest and breathe slowly and easily.  It can be helpful to keep track of a log of your progress. Your caregiver can provide you with a simple table to help with this. If you are using the spirometer at home, follow these instructions: [place] IF:   You are having difficultly using the spirometer.  You have trouble using the spirometer as often as instructed.  Your pain medication is not giving enough relief while using the spirometer.  You develop fever of 100.5 F (38.1 C) or higher. SEEK IMMEDIATE MEDICAL CARE IF:   You cough up bloody sputum that had not been present before.  You develop fever of 102 F (38.9 C) or greater.  You develop worsening pain at or near the incision site. MAKE SURE YOU:   Understand these instructions.  Will watch your condition.  Will get help right away if you are not doing well or get worse. Document Released: 06/04/2006 Document Revised: 04/16/2011 Document Reviewed: 08/05/2006 Exit[employer] Patient Information 2014 [employer], [place].   ________________________________________________________________________  WHAT IS A BLOOD TRANSFUSION? Blood Transfusion Information  A transfusion is the replacement of blood or some of its parts. Blood is made up of multiple cells which provide different functions.  Red blood cells carry oxygen and are used for blood loss replacement.  White blood cells fight against infection.  Platelets control bleeding.  Plasma helps clot blood.  Other blood products are available for specialized needs, such as hemophilia or other clotting disorders. BEFORE THE TRANSFUSION  Who gives blood for transfusions?   Healthy volunteers who are fully evaluated to make sure their blood is safe. This is blood bank blood. Transfusion therapy is the safest it has ever been in the practice of medicine. Before blood is taken from a donor, a complete history is taken to make sure that person has  no history of diseases nor engages in risky social behavior (examples are intravenous drug use or sexual activity with multiple partners). The donor's travel history is screened to minimize risk of transmitting infections, such as malaria. The donated blood is tested for signs of infectious diseases, such as HIV and hepatitis. The blood is then tested to be sure it is compatible with you in order to minimize the chance of a transfusion reaction. If you or a relative donates blood, this is often done in anticipation of surgery and is not appropriate for emergency situations. It takes many days to process the donated blood. RISKS AND COMPLICATIONS Although transfusion therapy is very safe and saves many lives, the main dangers of transfusion include:   Getting an infectious disease.  Developing a transfusion reaction. This is an allergic reaction to something in the blood you were given. Every precaution is taken to prevent this. The decision to have a blood transfusion has been considered carefully by your caregiver before blood is given. Blood is not given unless the benefits outweigh the risks. AFTER THE TRANSFUSION  Right after receiving a blood transfusion, you will usually feel much better and more energetic. This is especially true  if your red blood cells have gotten low (anemic). The transfusion raises the level of the red blood cells which carry oxygen, and this usually causes an energy increase.  The nurse administering the transfusion will monitor you carefully for complications. HOME CARE INSTRUCTIONS  No special instructions are needed after a transfusion. You may find your energy is better. Speak with your caregiver about any limitations on activity for underlying diseases you may have. SEEK MEDICAL CARE IF:   Your condition is not improving after your transfusion.  You develop redness or irritation at the intravenous (IV) site. SEEK IMMEDIATE MEDICAL CARE IF:  Any of the following  symptoms occur over the next 12 hours:  Shaking chills.  You have a temperature by mouth above 102 F (38.9 C), not controlled by medicine.  Chest, back, or muscle pain.  People around you feel you are not acting correctly or are confused.  Shortness of breath or difficulty breathing.  Dizziness and fainting.  You get a rash or develop hives.  You have a decrease in urine output.  Your urine turns a dark color or changes to pink, red, or brown. Any of the following symptoms occur over the next 10 days:  You have a temperature by mouth above 102 F (38.9 C), not controlled by medicine.  Shortness of breath.  Weakness after normal activity.  The white part of the eye turns yellow (jaundice).  You have a decrease in the amount of urine or are urinating less often.  Your urine turns a dark color or changes to pink, red, or brown. Document Released: 01/20/2000 Document Revised: 04/16/2011 Document Reviewed: 09/08/2007 [facility] Patient Information 2014 [place], [place].  _______________________________________________________________________

## 2017-11-04 ENCOUNTER — Ambulatory Visit (HOSPITAL_COMMUNITY)
Admission: RE | Admit: 2017-11-04 | Discharge: 2017-11-04 | Disposition: A | Discharge status: Home / Self Care | Payer: Medicaid Other | Source: Ambulatory Visit | Attending: Orthopedic Surgery | Admitting: Orthopedic Surgery

## 2017-11-04 ENCOUNTER — Encounter (HOSPITAL_COMMUNITY)
Admission: RE | Admit: 2017-11-04 | Discharge: 2017-11-04 | Disposition: A | Discharge status: Home / Self Care | Payer: Medicaid Other | Source: Ambulatory Visit | Attending: Orthopedic Surgery | Admitting: Orthopedic Surgery

## 2017-11-04 ENCOUNTER — Other Ambulatory Visit: Payer: Self-pay

## 2017-11-04 ENCOUNTER — Encounter (HOSPITAL_COMMUNITY): Payer: Self-pay

## 2017-11-04 DIAGNOSIS — Z01818 Encounter for other preprocedural examination: Secondary | ICD-10-CM | POA: Diagnosis present

## 2017-11-04 HISTORY — DX: Depression, unspecified: F32.A

## 2017-11-04 HISTORY — DX: Anxiety disorder, unspecified: F41.9

## 2017-11-04 HISTORY — DX: Unspecified osteoarthritis, unspecified site: M19.90

## 2017-11-04 HISTORY — DX: Anemia, unspecified: D64.9

## 2017-11-04 HISTORY — DX: Major depressive disorder, single episode, unspecified: F32.9

## 2017-11-04 LAB — PROTIME-INR
INR: 0.91
Prothrombin Time: 12.2 seconds (ref 11.4–15.2)

## 2017-11-04 LAB — BASIC METABOLIC PANEL
ANION GAP: 10 (ref 5–15)
BUN: 10 mg/dL (ref 6–20)
CHLORIDE: 105 mmol/L (ref 98–111)
CO2: 26 mmol/L (ref 22–32)
Calcium: 9 mg/dL (ref 8.9–10.3)
Creatinine, Ser: 0.68 mg/dL (ref 0.44–1.00)
GFR calc Af Amer: >60 mL/min (ref >60)
Glucose, Bld: 82 mg/dL (ref 70–99)
POTASSIUM: 3.7 mmol/L (ref 3.5–5.1)
Sodium: 141 mmol/L (ref 135–145)

## 2017-11-04 LAB — SURGICAL PCR SCREEN
MRSA, PCR: NEGATIVE
Staphylococcus aureus: NEGATIVE

## 2017-11-04 LAB — ABO/RH: ABO/RH(D): O POS

## 2017-11-04 LAB — URINALYSIS, ROUTINE W REFLEX MICROSCOPIC
Bilirubin Urine: NEGATIVE
Glucose, UA: NEGATIVE mg/dL
HGB URINE DIPSTICK: NEGATIVE
Ketones, ur: 5 mg/dL — AB
Leukocytes, UA: NEGATIVE
Nitrite: NEGATIVE
Protein, ur: NEGATIVE mg/dL
Specific Gravity, Urine: 1.028 (ref 1.005–1.030)
pH: 6 (ref 5.0–8.0)

## 2017-11-04 LAB — CBC WITH DIFFERENTIAL/PLATELET
BASOS ABS: 0 10*3/uL (ref 0.0–0.1)
Basophils Relative: 0 %
Eosinophils Absolute: 0.2 10*3/uL (ref 0.0–0.7)
Eosinophils Relative: 2 %
HEMATOCRIT: 37.6 % (ref 36.0–46.0)
Hemoglobin: 12.4 g/dL (ref 12.0–15.0)
LYMPHS ABS: 2.9 10*3/uL (ref 0.7–4.0)
LYMPHS PCT: 38 %
MCH: 33.1 pg (ref 26.0–34.0)
MCHC: 33 g/dL (ref 30.0–36.0)
MCV: 100.3 fL — AB (ref 78.0–100.0)
Monocytes Absolute: 0.6 10*3/uL (ref 0.1–1.0)
Monocytes Relative: 7 %
NEUTROS ABS: 4 10*3/uL (ref 1.7–7.7)
Neutrophils Relative %: 53 %
Platelets: 319 10*3/uL (ref 150–400)
RBC: 3.75 MIL/uL — AB (ref 3.87–5.11)
RDW: 13.8 % (ref 11.5–15.5)
WBC: 7.7 10*3/uL (ref 4.0–10.5)

## 2017-11-04 LAB — APTT: APTT: 33 s (ref 24–36)

## 2017-11-04 LAB — HCG, SERUM, QUALITATIVE: Preg, Serum: NEGATIVE

## 2017-11-08 DIAGNOSIS — M1612 Unilateral primary osteoarthritis, left hip: Secondary | ICD-10-CM | POA: Diagnosis present

## 2017-11-08 NOTE — H&P (Signed)
TOTAL HIP ADMISSION H&P  Patient is admitted for right total hip arthroplasty.  Subjective:  Chief Complaint: right hip pain  HPI: [name], 37 y.o. female, has a history of pain and functional disability in the right hip(s) due to arthritis and patient has failed non-surgical conservative treatments for greater than 12 weeks to include NSAID's and/or analgesics, corticosteriod injections, weight reduction as appropriate and activity modification.  Onset of symptoms was gradual starting 3 years ago with gradually worsening course since that time.The patient noted no past surgery on the right hip(s).  Patient currently rates pain in the right hip at 10 out of 10 with activity. Patient has night pain, worsening of pain with activity and weight bearing, pain that interfers with activities of daily living and pain with passive range of motion. Patient has evidence of subchondral sclerosis and joint space narrowing by imaging studies. This condition presents safety issues increasing the risk of falls.    There is no current active infection.  There are no active problems to display for this patient.  Past Medical History:  Diagnosis Date  . Anemia yrs ago  . Anxiety yrs ago  . Arthritis    oa  . Depression yrs ago    Past Surgical History:  Procedure Laterality Date  . TUBAL LIGATION      No current facility-administered medications for this encounter.    Current Outpatient Medications  Medication Sig Dispense Refill Last Dose  . ibuprofen (ADVIL,MOTRIN) 200 MG tablet Take 200-600 mg by mouth every 6 (six) hours as needed for headache or moderate pain.      . traMADol (ULTRAM) 50 MG tablet Take 50 mg by mouth every 6 (six) hours as needed for severe pain.       No Known Allergies  Social History   Tobacco Use  . Smoking status: Current Every Day Smoker    Packs/day: 0.25    Years: 24.00    Pack years: 6.00    Types: Cigarettes  . Smokeless tobacco: Never Used  Substance  Use Topics  . Alcohol use: Not Currently    No family history on file.   Review of Systems  Constitutional: Negative.   HENT: Negative.   Eyes: Negative.   Respiratory: Negative.   Cardiovascular: Negative.   Gastrointestinal: Negative.   Genitourinary: Negative.   Musculoskeletal: Positive for joint pain and myalgias.  Skin: Negative.   Neurological: Negative.   Endo/Heme/Allergies: Negative.   Psychiatric/Behavioral: Negative.     Objective:  Physical Exam  Constitutional: She is oriented to person, place, and time. She appears well-developed and well-nourished.  HENT:  Head: Normocephalic and atraumatic.  Eyes: Pupils are equal, round, and reactive to light.  Neck: Normal range of motion. Neck supple.  Cardiovascular: Intact distal pulses.  Respiratory: Effort normal.  Musculoskeletal:  She has tenderness to palpation along the greater trochanter bilaterally as well as the inguinal area on both hips.  She has limited range of motion in hip flexion, due to pain.  She is able to get to around 100 of hip flexion before pain initiates.  She gets around 30 of hip reduction prior to pain.  Strength 5 out of 5 throughout testing.  Positive log roll bilaterally.  Positive FA BER test.  Positive FA DIR test.  Negative straight leg raise.  Neurological: She is alert and oriented to person, place, and time.  Skin: Skin is warm and dry.  Psychiatric: She has a normal mood and affect. Her behavior is  normal. Judgment and thought content normal.    Vital signs in last 24 hours:    Labs:   Estimated body mass index is 36.9 kg/m as calculated from the following:   Height as of 11/04/17: 5' 2.5" (1.588 m).   Weight as of 11/04/17: 93 kg.   Imaging Review Plain radiographs demonstrate  2 view of right hip.  Severe degenerative changes noted on the superior aspect of the joint line bilaterally.  There is some sclerotic change noted on superior joint line of both  hips.    Preoperative templating of the joint replacement has been completed, documented, and submitted to the Operating Room personnel in order to optimize intra-operative equipment management.     Assessment/Plan:  End stage arthritis, right hip(s)  The patient history, physical examination, clinical judgement of the provider and imaging studies are consistent with end stage degenerative joint disease of the right hip(s) and total hip arthroplasty is deemed medically necessary. The treatment options including medical management, injection therapy, arthroscopy and arthroplasty were discussed at length. The risks and benefits of total hip arthroplasty were presented and reviewed. The risks due to aseptic loosening, infection, stiffness, dislocation/subluxation,  thromboembolic complications and other imponderables were discussed.  The patient acknowledged the explanation, agreed to proceed with the plan and consent was signed. Patient is being admitted for inpatient treatment for surgery, pain control, PT, OT, prophylactic antibiotics, VTE prophylaxis, progressive ambulation and ADL's and discharge planning.The patient is planning to be discharged home with home health services

## 2017-11-10 MED ORDER — BUPIVACAINE LIPOSOME 1.3 % IJ SUSP
20.0000 mL | Freq: Once | INTRAMUSCULAR | Status: DC
  Filled 2017-11-10: qty 20

## 2017-11-10 MED ORDER — TRANEXAMIC ACID 1000 MG/10ML IV SOLN
2000.0000 mg | INTRAVENOUS | Status: DC
  Filled 2017-11-10: qty 20

## 2017-11-11 ENCOUNTER — Inpatient Hospital Stay (HOSPITAL_COMMUNITY): Payer: Medicaid Other | Admitting: Anesthesiology

## 2017-11-11 ENCOUNTER — Inpatient Hospital Stay (HOSPITAL_COMMUNITY): Payer: Medicaid Other

## 2017-11-11 ENCOUNTER — Inpatient Hospital Stay (HOSPITAL_COMMUNITY)
Admission: RE | Admit: 2017-11-11 | Discharge: 2017-11-12 | DRG: 470 | Disposition: A | Discharge status: Home / Self Care | Payer: Medicaid Other | Source: Ambulatory Visit | Attending: Orthopedic Surgery | Admitting: Orthopedic Surgery

## 2017-11-11 ENCOUNTER — Encounter (HOSPITAL_COMMUNITY)
Admission: RE | Disposition: A | Discharge status: Home / Self Care | Payer: Self-pay | Source: Ambulatory Visit | Attending: Orthopedic Surgery

## 2017-11-11 ENCOUNTER — Other Ambulatory Visit: Payer: Self-pay

## 2017-11-11 ENCOUNTER — Encounter (HOSPITAL_COMMUNITY): Payer: Self-pay

## 2017-11-11 DIAGNOSIS — E669 Obesity, unspecified: Secondary | ICD-10-CM | POA: Diagnosis present

## 2017-11-11 DIAGNOSIS — D62 Acute posthemorrhagic anemia: Secondary | ICD-10-CM | POA: Diagnosis not present

## 2017-11-11 DIAGNOSIS — F17210 Nicotine dependence, cigarettes, uncomplicated: Secondary | ICD-10-CM | POA: Diagnosis present

## 2017-11-11 DIAGNOSIS — Z6836 Body mass index (BMI) 36.0-36.9, adult: Secondary | ICD-10-CM

## 2017-11-11 DIAGNOSIS — M1612 Unilateral primary osteoarthritis, left hip: Secondary | ICD-10-CM | POA: Diagnosis present

## 2017-11-11 DIAGNOSIS — M1611 Unilateral primary osteoarthritis, right hip: Principal | ICD-10-CM | POA: Diagnosis present

## 2017-11-11 DIAGNOSIS — M8700 Idiopathic aseptic necrosis of unspecified bone: Secondary | ICD-10-CM

## 2017-11-11 HISTORY — PX: TOTAL HIP ARTHROPLASTY: SHX124

## 2017-11-11 LAB — TYPE AND SCREEN
ABO/RH(D): O POS
ANTIBODY SCREEN: NEGATIVE

## 2017-11-11 SURGERY — ARTHROPLASTY, HIP, TOTAL, ANTERIOR APPROACH
Anesthesia: Spinal | Site: Hip | Laterality: Right

## 2017-11-11 MED ORDER — ONDANSETRON HCL 4 MG/2ML IJ SOLN: 4.0000 mg | Freq: Four times a day (QID) | INTRAMUSCULAR | Status: DC | PRN

## 2017-11-11 MED ORDER — FENTANYL CITRATE (PF) 100 MCG/2ML IJ SOLN
INTRAMUSCULAR | Status: DC | PRN
  Administered 2017-11-11: 100 ug via INTRAVENOUS

## 2017-11-11 MED ORDER — PROMETHAZINE HCL 25 MG/ML IJ SOLN: 6.2500 mg | INTRAMUSCULAR | Status: DC | PRN

## 2017-11-11 MED ORDER — LIDOCAINE 2% (20 MG/ML) 5 ML SYRINGE
INTRAMUSCULAR | Status: DC | PRN
  Administered 2017-11-11: 80 mg via INTRAVENOUS

## 2017-11-11 MED ORDER — LACTATED RINGERS IV SOLN
INTRAVENOUS | Status: DC
  Administered 2017-11-11: 10:00:00 via INTRAVENOUS
  Administered 2017-11-11: 1000 mL via INTRAVENOUS

## 2017-11-11 MED ORDER — DOCUSATE SODIUM 100 MG PO CAPS
100.0000 mg | ORAL_CAPSULE | Freq: Two times a day (BID) | ORAL | Status: DC
  Administered 2017-11-11 – 2017-11-12 (×2): 100 mg via ORAL
  Filled 2017-11-11 (×2): qty 1

## 2017-11-11 MED ORDER — KCL IN DEXTROSE-NACL 20-5-0.45 MEQ/L-%-% IV SOLN
INTRAVENOUS | Status: DC
  Administered 2017-11-11 – 2017-11-12 (×3): via INTRAVENOUS
  Filled 2017-11-11 (×4): qty 1000

## 2017-11-11 MED ORDER — MIDAZOLAM HCL 2 MG/2ML IJ SOLN
INTRAMUSCULAR | Status: AC
  Filled 2017-11-11: qty 2

## 2017-11-11 MED ORDER — MEPERIDINE HCL 50 MG/ML IJ SOLN: 6.2500 mg | INTRAMUSCULAR | Status: DC | PRN

## 2017-11-11 MED ORDER — 0.9 % SODIUM CHLORIDE (POUR BTL) OPTIME
TOPICAL | Status: DC | PRN
  Administered 2017-11-11: 1000 mL

## 2017-11-11 MED ORDER — DEXAMETHASONE SODIUM PHOSPHATE 10 MG/ML IJ SOLN
INTRAMUSCULAR | Status: AC
  Filled 2017-11-11: qty 1

## 2017-11-11 MED ORDER — SODIUM CHLORIDE 0.9 % IJ SOLN
INTRAMUSCULAR | Status: AC
  Filled 2017-11-11: qty 50

## 2017-11-11 MED ORDER — ACETAMINOPHEN 325 MG PO TABS
325.0000 mg | ORAL_TABLET | Freq: Four times a day (QID) | ORAL | Status: DC | PRN
  Administered 2017-11-12: 650 mg via ORAL
  Filled 2017-11-11: qty 2

## 2017-11-11 MED ORDER — PANTOPRAZOLE SODIUM 40 MG PO TBEC
40.0000 mg | DELAYED_RELEASE_TABLET | Freq: Every day | ORAL | Status: DC
  Administered 2017-11-11 – 2017-11-12 (×2): 40 mg via ORAL
  Filled 2017-11-11: qty 1

## 2017-11-11 MED ORDER — SODIUM CHLORIDE 0.9% FLUSH
INTRAVENOUS | Status: DC | PRN
  Administered 2017-11-11: 50 mL

## 2017-11-11 MED ORDER — HYDROMORPHONE HCL 1 MG/ML IJ SOLN
INTRAMUSCULAR | Status: AC
  Filled 2017-11-11: qty 1

## 2017-11-11 MED ORDER — ACETAMINOPHEN 500 MG PO TABS
1000.0000 mg | ORAL_TABLET | Freq: Four times a day (QID) | ORAL | Status: AC
  Administered 2017-11-11 – 2017-11-12 (×4): 1000 mg via ORAL
  Filled 2017-11-11 (×4): qty 2

## 2017-11-11 MED ORDER — HYDROMORPHONE HCL 1 MG/ML IJ SOLN
0.2500 mg | INTRAMUSCULAR | Status: DC | PRN
  Administered 2017-11-11 (×2): 0.5 mg via INTRAVENOUS

## 2017-11-11 MED ORDER — TRANEXAMIC ACID 1000 MG/10ML IV SOLN
1000.0000 mg | INTRAVENOUS | Status: AC
  Administered 2017-11-11: 1000 mg via INTRAVENOUS

## 2017-11-11 MED ORDER — CEFAZOLIN SODIUM-DEXTROSE 2-4 GM/100ML-% IV SOLN
2.0000 g | INTRAVENOUS | Status: AC
  Administered 2017-11-11: 2 g via INTRAVENOUS

## 2017-11-11 MED ORDER — LACTATED RINGERS IV SOLN: INTRAVENOUS | Status: DC

## 2017-11-11 MED ORDER — BUPIVACAINE LIPOSOME 1.3 % IJ SUSP
INTRAMUSCULAR | Status: DC | PRN
  Administered 2017-11-11: 20 mL

## 2017-11-11 MED ORDER — DIPHENHYDRAMINE HCL 12.5 MG/5ML PO ELIX: 12.5000 mg | ORAL_SOLUTION | ORAL | Status: DC | PRN

## 2017-11-11 MED ORDER — ONDANSETRON HCL 4 MG/2ML IJ SOLN
INTRAMUSCULAR | Status: DC | PRN
  Administered 2017-11-11: 4 mg via INTRAVENOUS

## 2017-11-11 MED ORDER — BISACODYL 5 MG PO TBEC: 5.0000 mg | DELAYED_RELEASE_TABLET | Freq: Every day | ORAL | Status: DC | PRN

## 2017-11-11 MED ORDER — ALUM & MAG HYDROXIDE-SIMETH 200-200-20 MG/5ML PO SUSP: 30.0000 mL | ORAL | Status: DC | PRN

## 2017-11-11 MED ORDER — FLEET ENEMA 7-19 GM/118ML RE ENEM: 1.0000 | ENEMA | Freq: Once | RECTAL | Status: DC | PRN

## 2017-11-11 MED ORDER — METHOCARBAMOL 500 MG PO TABS
500.0000 mg | ORAL_TABLET | Freq: Four times a day (QID) | ORAL | Status: DC | PRN
  Administered 2017-11-11 – 2017-11-12 (×3): 500 mg via ORAL
  Filled 2017-11-11 (×4): qty 1

## 2017-11-11 MED ORDER — PROPOFOL 500 MG/50ML IV EMUL
INTRAVENOUS | Status: DC | PRN
  Administered 2017-11-11: 80 ug/kg/min via INTRAVENOUS

## 2017-11-11 MED ORDER — DEXAMETHASONE SODIUM PHOSPHATE 10 MG/ML IJ SOLN
INTRAMUSCULAR | Status: DC | PRN
  Administered 2017-11-11: 10 mg via INTRAVENOUS

## 2017-11-11 MED ORDER — METHOCARBAMOL 500 MG IVPB - SIMPLE MED
500.0000 mg | Freq: Four times a day (QID) | INTRAVENOUS | Status: DC | PRN
  Administered 2017-11-11: 500 mg via INTRAVENOUS
  Filled 2017-11-11: qty 50

## 2017-11-11 MED ORDER — BUPIVACAINE HCL (PF) 0.25 % IJ SOLN
INTRAMUSCULAR | Status: DC | PRN
  Administered 2017-11-11: 20 mL

## 2017-11-11 MED ORDER — PHENOL 1.4 % MT LIQD: 1.0000 | OROMUCOSAL | Status: DC | PRN

## 2017-11-11 MED ORDER — MENTHOL 3 MG MT LOZG: 1.0000 | LOZENGE | OROMUCOSAL | Status: DC | PRN

## 2017-11-11 MED ORDER — METOCLOPRAMIDE HCL 5 MG PO TABS: 5.0000 mg | ORAL_TABLET | Freq: Three times a day (TID) | ORAL | Status: DC | PRN

## 2017-11-11 MED ORDER — TIZANIDINE HCL 2 MG PO TABS: 2.0000 mg | ORAL_TABLET | Freq: Four times a day (QID) | ORAL | 0 refills | Status: DC | PRN

## 2017-11-11 MED ORDER — OXYCODONE HCL 5 MG PO TABS
5.0000 mg | ORAL_TABLET | ORAL | Status: DC | PRN
  Administered 2017-11-11: 5 mg via ORAL
  Administered 2017-11-11: 10 mg via ORAL
  Administered 2017-11-11: 5 mg via ORAL
  Administered 2017-11-11 – 2017-11-12 (×2): 10 mg via ORAL
  Administered 2017-11-12: 5 mg via ORAL
  Administered 2017-11-12: 10 mg via ORAL
  Filled 2017-11-11: qty 2
  Filled 2017-11-11: qty 1
  Filled 2017-11-11: qty 2
  Filled 2017-11-11: qty 1
  Filled 2017-11-11 (×4): qty 2

## 2017-11-11 MED ORDER — TRANEXAMIC ACID 1000 MG/10ML IV SOLN
INTRAVENOUS | Status: AC
  Filled 2017-11-11: qty 10

## 2017-11-11 MED ORDER — METHOCARBAMOL 500 MG IVPB - SIMPLE MED
INTRAVENOUS | Status: AC
  Filled 2017-11-11: qty 50

## 2017-11-11 MED ORDER — STERILE WATER FOR IRRIGATION IR SOLN
Status: DC | PRN
  Administered 2017-11-11: 3000 mL

## 2017-11-11 MED ORDER — GABAPENTIN 300 MG PO CAPS
300.0000 mg | ORAL_CAPSULE | Freq: Three times a day (TID) | ORAL | Status: DC
  Administered 2017-11-11 – 2017-11-12 (×3): 300 mg via ORAL
  Filled 2017-11-11 (×3): qty 1

## 2017-11-11 MED ORDER — BUPIVACAINE HCL (PF) 0.25 % IJ SOLN
INTRAMUSCULAR | Status: AC
  Filled 2017-11-11: qty 30

## 2017-11-11 MED ORDER — PROPOFOL 10 MG/ML IV BOLUS
INTRAVENOUS | Status: AC
  Filled 2017-11-11: qty 60

## 2017-11-11 MED ORDER — FENTANYL CITRATE (PF) 100 MCG/2ML IJ SOLN
INTRAMUSCULAR | Status: AC
  Filled 2017-11-11: qty 2

## 2017-11-11 MED ORDER — OXYCODONE-ACETAMINOPHEN 5-325 MG PO TABS: 1.0000 | ORAL_TABLET | ORAL | 0 refills | Status: DC | PRN

## 2017-11-11 MED ORDER — ONDANSETRON HCL 4 MG PO TABS: 4.0000 mg | ORAL_TABLET | Freq: Four times a day (QID) | ORAL | Status: DC | PRN

## 2017-11-11 MED ORDER — MIDAZOLAM HCL 2 MG/2ML IJ SOLN
INTRAMUSCULAR | Status: DC | PRN
  Administered 2017-11-11: 0.5 mg via INTRAVENOUS
  Administered 2017-11-11: 1 mg via INTRAVENOUS
  Administered 2017-11-11: 0.5 mg via INTRAVENOUS
  Administered 2017-11-11: 2 mg via INTRAVENOUS

## 2017-11-11 MED ORDER — CELECOXIB 200 MG PO CAPS
200.0000 mg | ORAL_CAPSULE | Freq: Two times a day (BID) | ORAL | Status: DC
  Administered 2017-11-11 – 2017-11-12 (×3): 200 mg via ORAL
  Filled 2017-11-11 (×3): qty 1

## 2017-11-11 MED ORDER — TRANEXAMIC ACID 1000 MG/10ML IV SOLN
INTRAVENOUS | Status: DC | PRN
  Administered 2017-11-11: 2000 mg via TOPICAL

## 2017-11-11 MED ORDER — ONDANSETRON HCL 4 MG/2ML IJ SOLN
INTRAMUSCULAR | Status: AC
  Filled 2017-11-11: qty 2

## 2017-11-11 MED ORDER — ASPIRIN EC 81 MG PO TBEC: 81.0000 mg | DELAYED_RELEASE_TABLET | Freq: Two times a day (BID) | ORAL | 0 refills | Status: DC

## 2017-11-11 MED ORDER — HYDROMORPHONE HCL 1 MG/ML IJ SOLN
0.5000 mg | INTRAMUSCULAR | Status: DC | PRN
  Administered 2017-11-11: 0.5 mg via INTRAVENOUS
  Filled 2017-11-11 (×2): qty 1

## 2017-11-11 MED ORDER — TRANEXAMIC ACID 1000 MG/10ML IV SOLN
1000.0000 mg | Freq: Once | INTRAVENOUS | Status: AC
  Administered 2017-11-11: 1000 mg via INTRAVENOUS
  Filled 2017-11-11: qty 1000

## 2017-11-11 MED ORDER — ASPIRIN 81 MG PO CHEW
81.0000 mg | CHEWABLE_TABLET | Freq: Two times a day (BID) | ORAL | Status: DC
  Administered 2017-11-11 – 2017-11-12 (×2): 81 mg via ORAL
  Filled 2017-11-11 (×2): qty 1

## 2017-11-11 MED ORDER — DEXAMETHASONE SODIUM PHOSPHATE 10 MG/ML IJ SOLN
10.0000 mg | Freq: Once | INTRAMUSCULAR | Status: AC
  Administered 2017-11-12: 10 mg via INTRAVENOUS
  Filled 2017-11-11: qty 1

## 2017-11-11 MED ORDER — CEFAZOLIN SODIUM-DEXTROSE 2-4 GM/100ML-% IV SOLN
INTRAVENOUS | Status: AC
  Filled 2017-11-11: qty 100

## 2017-11-11 MED ORDER — CHLORHEXIDINE GLUCONATE 4 % EX LIQD: 60.0000 mL | Freq: Once | CUTANEOUS | Status: DC

## 2017-11-11 MED ORDER — METOCLOPRAMIDE HCL 5 MG/ML IJ SOLN: 5.0000 mg | Freq: Three times a day (TID) | INTRAMUSCULAR | Status: DC | PRN

## 2017-11-11 MED ORDER — LIDOCAINE HCL (CARDIAC) PF 100 MG/5ML IV SOSY
PREFILLED_SYRINGE | INTRAVENOUS | Status: AC
  Filled 2017-11-11: qty 5

## 2017-11-11 MED ORDER — POLYETHYLENE GLYCOL 3350 17 G PO PACK: 17.0000 g | PACK | Freq: Every day | ORAL | Status: DC | PRN

## 2017-11-11 SURGICAL SUPPLY — 43 items
BAG DECANTER FOR FLEXI CONT (MISCELLANEOUS) ×3 IMPLANT
BLADE SAW SGTL 18X1.27X75 (BLADE) ×2 IMPLANT
BLADE SAW SGTL 18X1.27X75MM (BLADE) ×1
COVER PERINEAL POST (MISCELLANEOUS) ×3 IMPLANT
COVER SURGICAL LIGHT HANDLE (MISCELLANEOUS) ×3 IMPLANT
CUP ACETBLR 52 OD 100 SERIES (Hips) ×3 IMPLANT
DECANTER SPIKE VIAL GLASS SM (MISCELLANEOUS) ×3 IMPLANT
DRAPE STERI IOBAN 125X83 (DRAPES) ×3 IMPLANT
DRAPE U-SHAPE 47X51 STRL (DRAPES) ×6 IMPLANT
DRSG AQUACEL AG ADV 3.5X 6 (GAUZE/BANDAGES/DRESSINGS) ×3 IMPLANT
DURAPREP 26ML APPLICATOR (WOUND CARE) ×3 IMPLANT
ELECT REM PT RETURN 15FT ADLT (MISCELLANEOUS) ×3 IMPLANT
ELIMINATOR HOLE APEX DEPUY (Hips) ×3 IMPLANT
GLOVE BIO SURGEON STRL SZ7.5 (GLOVE) ×3 IMPLANT
GLOVE BIO SURGEON STRL SZ8.5 (GLOVE) ×3 IMPLANT
GLOVE BIOGEL PI IND STRL 8 (GLOVE) ×1 IMPLANT
GLOVE BIOGEL PI IND STRL 9 (GLOVE) ×1 IMPLANT
GLOVE BIOGEL PI INDICATOR 8 (GLOVE) ×2
GLOVE BIOGEL PI INDICATOR 9 (GLOVE) ×2
GOWN STRL REUS W/ TWL LRG LVL3 (GOWN DISPOSABLE) ×1 IMPLANT
GOWN STRL REUS W/ TWL XL LVL3 (GOWN DISPOSABLE) ×1 IMPLANT
GOWN STRL REUS W/TWL LRG LVL3 (GOWN DISPOSABLE) ×2
GOWN STRL REUS W/TWL XL LVL3 (GOWN DISPOSABLE) ×2
HEAD CERAMIC DELTA 36 PLUS 1.5 (Hips) ×3 IMPLANT
LINER NEUTRAL 52X36MM PLUS 4 (Liner) ×3 IMPLANT
MANIFOLD NEPTUNE II (INSTRUMENTS) ×3 IMPLANT
NEEDLE HYPO 22GX1.5 SAFETY (NEEDLE) ×6 IMPLANT
PACK ANTERIOR HIP CUSTOM (KITS) ×3 IMPLANT
SLEEVE SURGEON STRL (DRAPES) ×3 IMPLANT
STEM TRI LOC GRIPTION SZ 4 STD (Hips) ×1 IMPLANT
SUT ETHIBOND NAB CT1 #1 30IN (SUTURE) ×3 IMPLANT
SUT VIC AB 0 CT1 27 (SUTURE)
SUT VIC AB 0 CT1 27XBRD ANBCTR (SUTURE) IMPLANT
SUT VIC AB 1 CTX 36 (SUTURE) ×2
SUT VIC AB 1 CTX36XBRD ANBCTR (SUTURE) ×1 IMPLANT
SUT VIC AB 2-0 CT1 27 (SUTURE) ×2
SUT VIC AB 2-0 CT1 TAPERPNT 27 (SUTURE) ×1 IMPLANT
SUT VIC AB 3-0 CT1 27 (SUTURE) ×2
SUT VIC AB 3-0 CT1 TAPERPNT 27 (SUTURE) ×1 IMPLANT
SYR CONTROL 10ML LL (SYRINGE) ×9 IMPLANT
TRAY FOLEY MTR SLVR 16FR STAT (SET/KITS/TRAYS/PACK) ×3 IMPLANT
TRI LOC GRIPTION SZ 4 STD (Hips) ×3 IMPLANT
YANKAUER SUCT BULB TIP 10FT TU (MISCELLANEOUS) ×3 IMPLANT

## 2017-11-11 NOTE — Anesthesia Procedure Notes (Signed)
Procedure Name: MAC Date/Time: 11/11/2017 9:42 AM Performed by: Dione Booze, CRNA Pre-anesthesia Checklist: Patient identified, Emergency Drugs available, Suction available and Patient being monitored Patient Re-evaluated:Patient Re-evaluated prior to induction Oxygen Delivery Method: Simple face mask Placement Confirmation: positive ETCO2

## 2017-11-11 NOTE — Interval H&P Note (Signed)
History and Physical Interval Note:  11/11/2017 8:33 AM  [name]  has presented today for surgery, with the diagnosis of RIGHT HIP DEGENERATIVE JOINT DISEASE  The various methods of treatment have been discussed with the patient and family. After consideration of risks, benefits and other options for treatment, the patient has consented to  Procedure(s): RIGHT TOTAL HIP ARTHROPLASTY ANTERIOR APPROACH (Right) as a surgical intervention .  The patient's history has been reviewed, patient examined, no change in status, stable for surgery.  I have reviewed the patient's chart and labs.  Questions were answered to the patient's satisfaction.     Nestor Lewandowsky

## 2017-11-11 NOTE — Transfer of Care (Signed)
Immediate Anesthesia Transfer of Care Note  Patient: [name]  Procedure(s) Performed: RIGHT TOTAL HIP ARTHROPLASTY ANTERIOR APPROACH (Right Hip)  Patient Location: PACU  Anesthesia Type:Spinal  Level of Consciousness: awake, alert , oriented and patient cooperative  Airway & Oxygen Therapy: Patient Spontanous Breathing and Patient connected to face mask oxygen  Post-op Assessment: Report given to RN and Post -op Vital signs reviewed and stable  Post vital signs: Reviewed and stable  Last Vitals:  Vitals Value Taken Time  BP 118/84 11/11/2017 11:37 AM  Temp    Pulse 75 11/11/2017 11:40 AM  Resp 16 11/11/2017 11:40 AM  SpO2 100 % 11/11/2017 11:40 AM  Vitals shown include unvalidated device data.  Last Pain:  Vitals:   11/11/17 0726  TempSrc:   PainSc: 2       Patients Stated Pain Goal: 4 (11/11/17 0726)  Complications: No apparent anesthesia complications

## 2017-11-11 NOTE — Plan of Care (Signed)

## 2017-11-11 NOTE — Anesthesia Postprocedure Evaluation (Signed)
Anesthesia Post Note  Patient: [name]  Procedure(s) Performed: RIGHT TOTAL HIP ARTHROPLASTY ANTERIOR APPROACH (Right Hip)     Patient location during evaluation: PACU Anesthesia Type: Spinal Level of consciousness: oriented and awake and alert Pain management: pain level controlled Vital Signs Assessment: post-procedure vital signs reviewed and stable Respiratory status: spontaneous breathing, respiratory function stable and patient connected to nasal cannula oxygen Cardiovascular status: blood pressure returned to baseline and stable Postop Assessment: no headache, no backache, no apparent nausea or vomiting, spinal receding and patient able to bend at knees Anesthetic complications: no    Last Vitals:  Vitals:   11/11/17 1247 11/11/17 1348  BP: 136/82 130/85  Pulse: 68 74  Resp: 16 16  Temp: 36.9 C 36.7 C  SpO2: 100% 100%    Last Pain:  Vitals:   11/11/17 1352  TempSrc:   PainSc: 8                  [name]

## 2017-11-11 NOTE — Evaluation (Signed)
Physical Therapy Evaluation Patient Details Name: [name] MRN: [id number] DOB: 02-May-1980 Today's Date: 11/11/2017   History of Present Illness  37 YO female s/p R DA-THA on 11/11/17. PMH includes anemia, anxiety, OA, depression.   Clinical Impression  Pt presents with moderate R hip and thigh pain, difficulty performing bed mobility/transfers, decreased tolerance for ambulation secondary to RLE pain, and decreased knowledge of use of DME. Pt to benefit from acute PT to address deficits. Pt ambulated 25 ft with RW with min guard assist, limited by pain. PT to progress mobility as able, and will continue to follow acutely.     Follow Up Recommendations Follow surgeon's recommendation for DC plan and follow-up therapies;Supervision for mobility/OOB(HHPT per PA note, pt reporting she is unsure of follow up plan )    Equipment Recommendations  Rolling walker with 5" wheels    Recommendations for Other Services       Precautions / Restrictions Precautions Precautions: Fall Restrictions Weight Bearing Restrictions: No Other Position/Activity Restrictions: WBAT       Mobility  Bed Mobility Overal bed mobility: Needs Assistance Bed Mobility: Supine to Sit     Supine to sit: Min assist;HOB elevated     General bed mobility comments: Assist for LE management, increased time to scoot to EOB. Verbal cuing for sequencing.   Transfers Overall transfer level: Needs assistance Equipment used: Rolling walker (2 wheeled) Transfers: Sit to/from Stand Sit to Stand: Min assist;From elevated surface         General transfer comment: Min assist for power up and steadying. Verbal cuing for hand placement.   Ambulation/Gait Ambulation/Gait assistance: Min guard Gait Distance (Feet): 25 Feet Assistive device: Rolling walker (2 wheeled) Gait Pattern/deviations: Step-to pattern;Decreased stride length;Decreased stance time - right;Decreased weight shift to right;Antalgic;Wide base  of support Gait velocity: decr    General Gait Details: Min guard for safety. Close guarding of RLE initially, decreased due to pt not presenting with RLE instability. Verbal cuing for sequencing x2, placement in RW, turning.   Stairs            Wheelchair Mobility    Modified Rankin (Stroke Patients Only)       Balance Overall balance assessment: Mild deficits observed, not formally tested                                           Pertinent Vitals/Pain Pain Assessment: 0-10 Pain Score: 5  Pain Location: R hip, thigh  Pain Descriptors / Indicators: Throbbing;Numbness Pain Intervention(s): Limited activity within patient's tolerance;Ice applied;Monitored during session;Premedicated before session    Home Living Family/patient expects to be discharged to:: Private residence Living Arrangements: Spouse/significant other;Children Available Help at Discharge: Family;Available 24 hours/day(Husband taking off time from work to take care of pt ) Type of Home: Other(Comment)(townhouse) Home Access: Stairs to enter Entrance Stairs-Rails: None(uses cane for stairs ) Entrance Stairs-Number of Steps: 2 Home Layout: Two level Home Equipment: Cane - single point      Prior Function Level of Independence: Independent with assistive device(s)         Comments: using cane for all ambulation starting March 2019, can walk without it for very short distances      Hand Dominance   Dominant Hand: Right    Extremity/Trunk Assessment   Upper Extremity Assessment Upper Extremity Assessment: Overall WFL for tasks assessed    Lower Extremity Assessment  Lower Extremity Assessment: Overall WFL for tasks assessed;RLE deficits/detail RLE Deficits / Details: Suspected post-surgical hip weakness; able to perform quad sets, ankle pumps  RLE Sensation: decreased light touch(decreased light touch around R knee, otherwise WNL)    Cervical / Trunk Assessment Cervical /  Trunk Assessment: Normal  Communication   Communication: No difficulties  Cognition Arousal/Alertness: Awake/alert Behavior During Therapy: WFL for tasks assessed/performed Overall Cognitive Status: Within Functional Limits for tasks assessed                                        General Comments      Exercises Total Joint Exercises Ankle Circles/Pumps: AROM;Both;10 reps;Seated   Assessment/Plan    PT Assessment Patient needs continued PT services  PT Problem List Decreased strength;Pain;Decreased range of motion;Decreased activity tolerance;Decreased knowledge of use of DME;Decreased balance;Decreased safety awareness;Decreased mobility       PT Treatment Interventions DME instruction;Therapeutic activities;Gait training;Therapeutic exercise;Patient/family education;Stair training;Balance training;Functional mobility training    PT Goals (Current goals can be found in the Care Plan section)  Acute Rehab PT Goals PT Goal Formulation: With patient Time For Goal Achievement: 11/25/17 Potential to Achieve Goals: Good    Frequency 7X/week   Barriers to discharge        Co-evaluation               AM-PAC PT "6 Clicks" Daily Activity  Outcome Measure Difficulty turning over in bed (including adjusting bedclothes, sheets and blankets)?: Unable Difficulty moving from lying on back to sitting on the side of the bed? : Unable Difficulty sitting down on and standing up from a chair with arms (e.g., wheelchair, bedside commode, etc,.)?: Unable Help needed moving to and from a bed to chair (including a wheelchair)?: A Little Help needed walking in hospital room?: A Little Help needed climbing 3-5 steps with a railing? : A Little 6 Click Score: 12    End of Session Equipment Utilized During Treatment: Gait belt Activity Tolerance: Patient tolerated treatment well;Patient limited by pain Patient left: in bed;with bed alarm set;with call bell/phone within  reach;with SCD's reapplied Nurse Communication: Mobility status PT Visit Diagnosis: Difficulty in walking, not elsewhere classified (R26.2);Other abnormalities of gait and mobility (R26.89)    Time: 0981-1914 PT Time Calculation (min) (ACUTE ONLY): 33 min   Charges:   PT Evaluation $PT Eval Low Complexity: 1 Low PT Treatments $Gait Training: 8-22 mins        Nicola Police, PT Acute Rehabilitation Services Pager [phone]  Office [phone]   Dawsyn Zurn D Despina Hidden 11/11/2017, 6:32 PM

## 2017-11-11 NOTE — Anesthesia Preprocedure Evaluation (Addendum)
Anesthesia Evaluation  Patient identified by MRN, date of birth, ID band Patient awake    Reviewed: Allergy & Precautions, NPO status , Patient's Chart, lab work & pertinent test results  Airway Mallampati: II  TM Distance: >3 FB Neck ROM: Full    Dental  (+) Teeth Intact, Dental Advisory Given   Pulmonary Current Smoker,    breath sounds clear to auscultation       Cardiovascular negative cardio ROS   Rhythm:Regular Rate:Normal     Neuro/Psych Anxiety Depression    GI/Hepatic negative GI ROS, Neg liver ROS,   Endo/Other  negative endocrine ROS  Renal/GU negative Renal ROS     Musculoskeletal  (+) Arthritis , Osteoarthritis,    Abdominal (+) + obese,   Peds  Hematology   Anesthesia Other Findings   Reproductive/Obstetrics                            Lab Results  Component Value Date   WBC 7.7 11/04/2017   HGB 12.4 11/04/2017   HCT 37.6 11/04/2017   MCV 100.3 (H) 11/04/2017   PLT 319 11/04/2017   Lab Results  Component Value Date   INR 0.91 11/04/2017   EKG: normal sinus rhythm.  Anesthesia Physical Anesthesia Plan  ASA: II  Anesthesia Plan: Spinal   Post-op Pain Management:    Induction: Intravenous  PONV Risk Score and Plan: 2 and Ondansetron and Propofol infusion  Airway Management Planned: Natural Airway and Simple Face Mask  Additional Equipment: None  Intra-op Plan:   Post-operative Plan:   Informed Consent: I have reviewed the patients History and Physical, chart, labs and discussed the procedure including the risks, benefits and alternatives for the proposed anesthesia with the patient or authorized representative who has indicated his/her understanding and acceptance.     Plan Discussed with: CRNA  Anesthesia Plan Comments:        Anesthesia Quick Evaluation

## 2017-11-11 NOTE — Anesthesia Procedure Notes (Signed)
Spinal

## 2017-11-11 NOTE — Op Note (Signed)
OPERATIVE REPORT    DATE OF PROCEDURE:  11/11/2017       PREOPERATIVE DIAGNOSIS:  RIGHT HIP DEGENERATIVE JOINT DISEASE                                                          POSTOPERATIVE DIAGNOSIS:  RIGHT HIP DEGENERATIVE JOINT DISEASE                                                           PROCEDURE: Anterior R total hip arthroplasty using a 52 mm DePuy Pinnacle  Cup, [employer], 0-degree polyethylene liner, a +1.5x36 mm ceramic head, a 4std Depuy Triloc stem   SURGEON: Nestor Lewandowsky    ASSISTANT:   Tomi Likens. [employer]  (present throughout entire procedure and necessary for timely completion of the procedure)   ANESTHESIA: Spinal BLOOD LOSS: 350cc FLUID REPLACEMENT: 1600cc crystalloid Antibiotic: 2gm ancef Tranexamic Acid: 1gm IV, 2gm Topical Exparel: 266mg  COMPLICATIONS: none    INDICATIONS FOR PROCEDURE: A 37 y.o. year-old With  RIGHT HIP DEGENERATIVE JOINT DISEASE   for 2 years, x-rays show bone-on-bone arthritic changes, and osteophytes. Despite conservative measures with observation, anti-inflammatory medicine, narcotics, use of a cane, has severe unremitting pain and can ambulate only a few blocks before resting. Patient desires elective R total hip arthroplasty to decrease pain and increase function. The risks, benefits, and alternatives were discussed at length including but not limited to the risks of infection, bleeding, nerve injury, stiffness, blood clots, the need for revision surgery, cardiopulmonary complications, among others, and they were willing to proceed. Questions answered      PROCEDURE IN DETAIL: The patient was identified by armband,   received preoperative IV antibiotics in the holding area at [facility], taken to the operating room , appropriate anesthetic monitors   were attached and  anesthesia was induced with the patienton the gurney. The HANA boots were applied to the feet and he was then transferred to the HANA table with a  peroneal post and support underneath the non-operative le, which was locked in 2 lb traction. Theoperative lower extremity was then prepped and draped in the usual sterile fashion from just above the iliac crest to the knee. And a timeout procedure was performed. We then made a 12 cm incision along the interval at the leading edge of the tensor fascia lata of starting at 2 cm lateral to and 2 cm distal to the ASIS. Small bleeders in the skin and subcutaneous tissue identified and cauterized we dissected down to the fascia and made an incision in the fascia allowing [place] to elevate the fascia of the tensor muscle and exploited the interval between the rectus and the tensor fascia lata. A Cobra retractor was then placed along the superior neck of the femur and a Cobra retractor along the inferior neck of the femur we teed the capsule starting out at the superior anterior aspect of the acetabulum going distally and made the T along the neck both leaflets of the T were tagged with #2 Ethibond suture. Cobra retractors were then placed along the inferior and  superior neck allowing [place] to perform a standard neck cut and removed the femoral head with a power corkscrew. We then placed a right angle Hohmann retractor along the anterior aspect of the acetabulum a long bent Homan in the cotyloid notch and posteriorly a Cobra retractor. We then sequentially reamed up to a 51 mm basket reamer obtaining good coverage in all quadrants, verified by C-arm imaging. Under C-arm control with and hammered into place a 52 mm Pinnacle cup in 45 of abduction and 15 of anteversion. The cup seated nicely and required no supplemental screws. We then placed a central hole Eliminator and a 0 polyethylene liner. The foot was then externally rotated to 130, the limb was extended and adducted delivering the proximal femur up into the wound. A medium Hohmann retractor was placed over the greater trochanter and a long Homan retractor along the  posterior femoral neck completing the exposure. We then performed releases superiorly and and inferiorly of the capsule going back to the pirformis fossa superiorly and to the lesser trochanter inferiorly. We then entered the proximal femur with the box cutting offset chisel followed by, a canal sounder, the chili pepper and broaching up to a 4std broach. This seated nicely and we reamed the calcar. A trial reduction was performed with a 1.5 mm 36 mm head.The limb lengths were excellent the hip was stable in 90 of external rotation. At this point the trial components removed and we hammered into place a # 4 std  Offset Tri-Lock stem with G[employer] coating. A + 1.5 36 mm ceramic ball was then hammered into place the hip was reduced and final C-arm images obtained. The wound was thoroughly irrigated with normal saline solution. We repaired the ant capsule and the tensor fascia lot a with running 0 vicryl suture. the subcutaneous tissue was closed with 2-0 and 3-0 Vicryl suture followed by an Aquacil dressing. At this point the patient was awaken and transferred to hospital gurney without difficulty. The subcutaneous tissue with 0 and 2-0 undyed Vicryl suture and the skin with running   3-0 vicryl subcuticular suture. Aquacil dressing was applied. The patient was then unclamped, rolled supine, awaken extubated and taken to recovery room without difficulty in stable condition.   Nestor Lewandowsky 11/11/2017, 11:02 AM

## 2017-11-12 ENCOUNTER — Encounter (HOSPITAL_COMMUNITY): Payer: Self-pay | Admitting: Orthopedic Surgery

## 2017-11-12 LAB — CBC
HCT: 33.9 % — ABNORMAL LOW (ref 36.0–46.0)
Hemoglobin: 11.5 g/dL — ABNORMAL LOW (ref 12.0–15.0)
MCH: 33.9 pg (ref 26.0–34.0)
MCHC: 33.9 g/dL (ref 30.0–36.0)
MCV: 100 fL (ref 80.0–100.0)
PLATELETS: 285 10*3/uL (ref 150–400)
RBC: 3.39 MIL/uL — ABNORMAL LOW (ref 3.87–5.11)
RDW: 13.9 % (ref 11.5–15.5)
WBC: 15 10*3/uL — ABNORMAL HIGH (ref 4.0–10.5)

## 2017-11-12 LAB — BASIC METABOLIC PANEL
Anion gap: 5 (ref 5–15)
BUN: 7 mg/dL (ref 6–20)
CALCIUM: 8.3 mg/dL — AB (ref 8.9–10.3)
CO2: 24 mmol/L (ref 22–32)
CREATININE: 0.6 mg/dL (ref 0.44–1.00)
Chloride: 111 mmol/L (ref 98–111)
GFR calc Af Amer: >60 mL/min (ref >60)
Glucose, Bld: 131 mg/dL — ABNORMAL HIGH (ref 70–99)
Potassium: 4 mmol/L (ref 3.5–5.1)
Sodium: 140 mmol/L (ref 135–145)

## 2017-11-12 NOTE — Progress Notes (Signed)
Patient ID: [name], female   DOB: 1980/09/15, 37 y.o.   MRN: [id number]  To Whom it may concern,  [name] was admitted 11/11/17 and discharged 11/12/17.    Sincerely,  Azizi Bally ClarkRN

## 2017-11-12 NOTE — Progress Notes (Signed)
PATIENT ID: [name]  MRN: [id number]  DOB/AGE:  1980/04/06 / 37 y.o.  1 Day Post-Op Procedure(s) (LRB): RIGHT TOTAL HIP ARTHROPLASTY ANTERIOR APPROACH (Right)    PROGRESS NOTE Subjective: Patient is alert, oriented, no Nausea, no Vomiting, yes passing gas, . Taking PO well. Denies SOB, Chest or Calf Pain. Using Incentive Spirometer, PAS in place. Ambulate 20' Patient reports pain as  2/10  .    Objective: Vital signs in last 24 hours: Vitals:   11/11/17 1743 11/11/17 2200 11/12/17 0208 11/12/17 0554  BP: 123/78 114/72 108/70 129/82  Pulse: 96 82 78 81  Resp: 16 16 18 18   Temp: 98.8 F (37.1 C) 99.1 F (37.3 C) 98.1 F (36.7 C) 99.5 F (37.5 C)  TempSrc: Oral Oral Oral Oral  SpO2: 100% 100% 100% 100%  Weight:      Height:          Intake/Output from previous day: I/O last 3 completed shifts: In: 4519.1 [P.O.:700; I.V.:3669.1; IV Piggyback:150] Out: 4300 [Urine:3800; Blood:500]   Intake/Output this shift: No intake/output data recorded.   LABORATORY DATA: Recent Labs    11/12/17 0453  WBC 15.0*  HGB 11.5*  HCT 33.9*  PLT 285  NA 140  K 4.0  CL 111  CO2 24  BUN 7  CREATININE 0.60  GLUCOSE 131*  CALCIUM 8.3*    Examination: Neurologically intact ABD soft Neurovascular intact Sensation intact distally Intact pulses distally Dorsiflexion/Plantar flexion intact Incision: dressing C/D/I No cellulitis present Compartment soft} XR AP&Lat of hip shows well placed\fixed THA  Assessment:   1 Day Post-Op Procedure(s) (LRB): RIGHT TOTAL HIP ARTHROPLASTY ANTERIOR APPROACH (Right) ADDITIONAL DIAGNOSIS:  Expected Acute Blood Loss Anemia,   Plan: PT/OT WBAT, THA  DVT Prophylaxis: SCDx72 hrs, ASA 81 mg BID x 2 weeks  DISCHARGE PLAN: Home,Today when patient passes physical therapy  DISCHARGE NEEDS: HHPT, Walker and 3-in-1 comode seat

## 2017-11-12 NOTE — Progress Notes (Signed)
Physical Therapy Treatment Patient Details Name: [name] MRN: [id number] DOB: [birth year] Today's Date: 11/12/2017    History of Present Illness 37 YO female s/p R DA-THA on 11/11/17. PMH includes anemia, anxiety, OA, depression.     PT Comments    Pt ambulated to/from bathroom and then performed LE exercises.  Reviewed exercises while going through HEP handout.  Pt reports understanding.  Daughter and spouse present.  Pt to d/c home later today.   Follow Up Recommendations  Follow surgeon's recommendation for DC plan and follow-up therapies;Supervision for mobility/OOB     Equipment Recommendations  Rolling walker with 5" wheels    Recommendations for Other Services       Precautions / Restrictions Precautions Precautions: Fall Restrictions Other Position/Activity Restrictions: WBAT     Mobility  Bed Mobility Overal bed mobility: Needs Assistance Bed Mobility: Supine to Sit;Sit to Supine     Supine to sit: Supervision Sit to supine: Supervision   General bed mobility comments: pt self assisted R LE with UEs, increased time  Transfers Overall transfer level: Needs assistance Equipment used: Rolling walker (2 wheeled) Transfers: Sit to/from Stand Sit to Stand: Min guard;Supervision         General transfer comment: verbal cues for UE and LE positioning, increased time  Ambulation/Gait Ambulation/Gait assistance: Supervision Gait Distance (Feet): 24 Feet Assistive device: Rolling walker (2 wheeled) Gait Pattern/deviations: Step-to pattern;Decreased stance time - right;Decreased weight shift to right;Antalgic     General Gait Details: verbal cues for posture, RW positioning       Wheelchair Mobility    Modified Rankin (Stroke Patients Only)       Balance                                            Cognition Arousal/Alertness: Awake/alert Behavior During Therapy: WFL for tasks assessed/performed Overall Cognitive  Status: Within Functional Limits for tasks assessed                                        Exercises Total Joint Exercises Ankle Circles/Pumps: AROM;Both;10 reps;Seated;Supine [employer] Sets: 10 reps;AROM;Both;Supine Heel Slides: AROM;10 reps;Right;Supine Hip ABduction/ADduction: AROM;10 reps;Right;Supine;Standing Knee Flexion: AROM;10 reps;Right;Standing Marching in Standing: AROM;10 reps;Right;Standing Standing Hip Extension: AROM;10 reps;Right;Standing    General Comments        Pertinent Vitals/Pain Pain Assessment: 0-10 Pain Score: 4  Pain Location: R hip, thigh  Pain Descriptors / Indicators: Aching;Discomfort;Sore Pain Intervention(s): Repositioned;Limited activity within patient's tolerance;Premedicated before session;Monitored during session    Home Living                      Prior Function            PT Goals (current goals can now be found in the care plan section) Progress towards PT goals: Progressing toward goals    Frequency    7X/week      PT Plan Current plan remains appropriate    Co-evaluation              AM-PAC PT "6 Clicks" Daily Activity  Outcome Measure  Difficulty turning over in bed (including adjusting bedclothes, sheets and blankets)?: A Lot Difficulty moving from lying on back to sitting on the side of the bed? : A Lot Difficulty sitting  down on and standing up from a chair with arms (e.g., wheelchair, bedside commode, etc,.)?: A Lot Help needed moving to and from a bed to chair (including a wheelchair)?: A Little Help needed walking in hospital room?: A Little Help needed climbing 3-5 steps with a railing? : A Little 6 Click Score: 15    End of Session Equipment Utilized During Treatment: Gait belt Activity Tolerance: Patient tolerated treatment well Patient left: in bed;with call bell/phone within reach;with family/visitor present Nurse Communication: Mobility status PT Visit Diagnosis: Difficulty  in walking, not elsewhere classified (R26.2);Other abnormalities of gait and mobility (R26.89)     Time: 5784-6962 PT Time Calculation (min) (ACUTE ONLY): 21 min  Charges:  $Therapeutic Exercise: 8-22 mins                     Zenovia Jarred, PT, DPT Acute Rehabilitation Services Office: [phone] Pager: [phone]   Sarajane Jews 11/12/2017, 3:54 PM

## 2017-11-12 NOTE — Discharge Summary (Signed)
Patient ID: [name] MRN: [id number] DOB/AGE: December 04, 1980 37 y.o.  Admit date: 11/11/2017 Discharge date: 11/12/2017  Admission Diagnoses:  Principal Problem:   Osteoarthritis of left hip Active Problems:   Primary osteoarthritis of right hip   Discharge Diagnoses:  Same  Past Medical History:  Diagnosis Date  . Anemia yrs ago  . Anxiety yrs ago  . Arthritis    oa  . Depression yrs ago    Surgeries: Procedure(s): RIGHT TOTAL HIP ARTHROPLASTY ANTERIOR APPROACH on 11/11/2017   Consultants:   Discharged Condition: Improved  Hospital Course: [name] is an 37 y.o. female who was admitted 11/11/2017 for operative treatment ofOsteoarthritis of left hip. Patient has severe unremitting pain that affects sleep, daily activities, and work/hobbies. After pre-op clearance the patient was taken to the operating room on 11/11/2017 and underwent  Procedure(s): RIGHT TOTAL HIP ARTHROPLASTY ANTERIOR APPROACH.    Patient was given perioperative antibiotics:  Anti-infectives (From admission, onward)   Start     Dose/Rate Route Frequency Ordered Stop   11/11/17 0703  ceFAZolin (ANCEF) 2-4 GM/100ML-% IVPB    Note to Pharmacy:  Curlene Dolphin   : cabinet override      11/11/17 0703 11/11/17 0939   11/11/17 0700  ceFAZolin (ANCEF) IVPB 2g/100 mL premix     2 g 200 mL/hr over 30 Minutes Intravenous On call to O.R. 11/11/17 5284 11/11/17 0954       Patient was given sequential compression devices, early ambulation, and chemoprophylaxis to prevent DVT.  Patient benefited maximally from hospital stay and there were no complications.    Recent vital signs:  Patient Vitals for the past 24 hrs:  BP Temp Temp src Pulse Resp SpO2 Height Weight  11/12/17 0554 129/82 99.5 F (37.5 C) Oral 81 18 100 % no documentation no documentation  11/12/17 0208 108/70 98.1 F (36.7 C) Oral 78 18 100 % no documentation no documentation  11/11/17 2200 114/72 99.1 F (37.3 C) Oral 82 16 100 %  no documentation no documentation  11/11/17 1743 123/78 98.8 F (37.1 C) Oral 96 16 100 % no documentation no documentation  11/11/17 1550 133/86 99.5 F (37.5 C) Oral 95 16 100 % no documentation no documentation  11/11/17 1448 137/87 98.8 F (37.1 C) Oral 85 16 100 % no documentation no documentation  11/11/17 1348 130/85 98.1 F (36.7 C) Oral 74 16 100 % no documentation no documentation  11/11/17 1247 136/82 98.5 F (36.9 C) Oral 68 16 100 % no documentation no documentation  11/11/17 1230 126/84 98.5 F (36.9 C) no documentation 69 14 100 % no documentation no documentation  11/11/17 1215 (Abnormal) 124/94 98.4 F (36.9 C) no documentation 71 13 99 % no documentation no documentation  11/11/17 1200 124/84 no documentation no documentation 70 14 98 % no documentation no documentation  11/11/17 1145 (Abnormal) 118/97 no documentation no documentation 64 11 100 % no documentation no documentation  11/11/17 1137 118/84 98.6 F (37 C) no documentation 72 14 100 % no documentation no documentation  11/11/17 0726 no documentation no documentation no documentation no documentation no documentation no documentation 5' 2.5" (1.588 m) 93 kg     Recent laboratory studies:  Recent Labs    11/12/17 0453  WBC 15.0*  HGB 11.5*  HCT 33.9*  PLT 285  NA 140  K 4.0  CL 111  CO2 24  BUN 7  CREATININE 0.60  GLUCOSE 131*  CALCIUM 8.3*     Discharge Medications:   Allergies  as of 11/12/2017   No Known Allergies     Medication List    Stop taking these medications   ibuprofen 200 MG tablet Commonly known as:  ADVIL,MOTRIN   traMADol 50 MG tablet Commonly known as:  ULTRAM     Take these medications   aspirin EC 81 MG tablet Take 1 tablet (81 mg total) by mouth 2 (two) times daily.   oxyCODONE-acetaminophen 5-325 MG tablet Commonly known as:  PERCOCET/ROXICET Take 1 tablet by mouth every 4 (four) hours as needed for severe pain.   tiZANidine 2 MG tablet Commonly known as:   ZANAFLEX Take 1 tablet (2 mg total) by mouth every 6 (six) hours as needed.        Durable Medical Equipment  (From admission, onward)         Start     Ordered   11/11/17 1250  DME Walker rolling  Once    Question:  Patient needs a walker to treat with the following condition  Answer:  Status post right hip replacement   11/11/17 1249   11/11/17 1250  DME 3 n 1  Once     11/11/17 1249           Discharge Care Instructions  (From admission, onward)         Start     Ordered   11/12/17 0000  Change dressing    Comments:  You may change your dressing if greater than 40% drainage and window   11/12/17 0715          Diagnostic Studies: Dg Chest 2 View  Result Date: 11/04/2017 CLINICAL DATA:  Preoperative examination prior to total hip joint replacement. Current smoker. No chest complaints. EXAM: CHEST - 2 VIEW COMPARISON:  None. FINDINGS: The lungs are well-expanded and clear. The heart and pulmonary vascularity are normal. There is no pleural effusion. The trachea is midline. The bony thorax exhibits no acute abnormality. IMPRESSION: There is no active cardiopulmonary disease. Electronically Signed   By: David  [place] M.D.   On: 11/04/2017 16:36   Dg C-arm 1-60 Min-no Report  Result Date: 11/11/2017 Fluoroscopy was utilized by the requesting physician.  No radiographic interpretation.   Dg Hip Operative Unilat With Pelvis Right  Result Date: 11/11/2017 CLINICAL DATA:  Anterior RIGHT hip replacement EXAM: OPERATIVE RIGHT HIP (WITH PELVIS IF PERFORMED) 2 VIEWS TECHNIQUE: Fluoroscopic spot image(s) were submitted for interpretation post-operatively. COMPARISON:  None FLUOROSCOPY TIME:  0 minutes 11 seconds Dose: 1.11 mGy Images: 2 FINDINGS: RIGHT hip replacement identified. No fracture, dislocation or bone destruction. IMPRESSION: RIGHT hip prosthesis without acute complication. Electronically Signed   By: Ulyses Southward M.D.   On: 11/11/2017 11:20    Disposition: Discharge  disposition: 01-Home or Self Care       Discharge Instructions    Ambulatory referral to Physical Therapy   Complete by:  As directed    S/P right anterior total hip   Iontophoresis - 4 mg/ml of dexamethasone:  No   T.E.N.S. Unit Evaluation and Dispense as Indicated:  No   Call MD / Call 911   Complete by:  As directed    If you experience chest pain or shortness of breath, CALL 911 and be transported to the hospital emergency room.  If you develope a fever above 101 F, pus (white drainage) or increased drainage or redness at the wound, or calf pain, call your surgeon's office.   Change dressing   Complete by:  As directed  You may change your dressing if greater than 40% drainage and window   Constipation Prevention   Complete by:  As directed    Drink plenty of fluids.  Prune juice may be helpful.  You may use a stool softener, such as Colace (over the counter) 100 mg twice a day.  Use MiraLax (over the counter) for constipation as needed.   Diet - low sodium heart healthy   Complete by:  As directed    Increase activity slowly as tolerated   Complete by:  As directed       Follow-up Information    Gean Birchwood, MD In 2 weeks.   Specialty:  Orthopedic Surgery Contact information: [address] [place] [address] [phone]            Signed: Nestor Lewandowsky 11/12/2017, 7:16 AM

## 2017-11-12 NOTE — Progress Notes (Signed)
Physical Therapy Treatment Patient Details Name: [name] MRN: [id number] DOB: May 23, 1980 Today's Date: 11/12/2017    History of Present Illness 37 YO female s/p R DA-THA on 11/11/17. PMH includes anemia, anxiety, OA, depression.     PT Comments    Pt ambulated in hallway and requiring min/guard for cues and safety.  Pt practiced safe stair technique.  Plan to return this afternoon and review HEP.  Follow Up Recommendations  Follow surgeon's recommendation for DC plan and follow-up therapies;Supervision for mobility/OOB     Equipment Recommendations  Rolling walker with 5" wheels    Recommendations for Other Services       Precautions / Restrictions Precautions Precautions: Fall Restrictions Other Position/Activity Restrictions: WBAT     Mobility  Bed Mobility Overal bed mobility: Needs Assistance Bed Mobility: Supine to Sit;Sit to Supine     Supine to sit: Min guard Sit to supine: Min guard   General bed mobility comments: pt self assisted R LE with UEs  Transfers Overall transfer level: Needs assistance Equipment used: Rolling walker (2 wheeled) Transfers: Sit to/from Stand Sit to Stand: Min guard;From elevated surface         General transfer comment: min/guard for safety, verbal cues for UE and LE positioning  Ambulation/Gait Ambulation/Gait assistance: Min guard Gait Distance (Feet): 200 Feet Assistive device: Rolling walker (2 wheeled) Gait Pattern/deviations: Step-to pattern;Decreased stance time - right;Decreased weight shift to right;Antalgic     General Gait Details: min/guard for safety, verbal cues for sequencing, posture, RW positioning   Stairs Stairs: Yes Stairs assistance: Min guard Stair Management: Step to pattern;Forwards;With cane;One rail Right Number of Stairs: 3 General stair comments: verbal cues for sequencing and safety, performed on shorter steps and then taller steps, pt reports understanding, daughter present and  observed   Wheelchair Mobility    Modified Rankin (Stroke Patients Only)       Balance                                            Cognition Arousal/Alertness: Awake/alert Behavior During Therapy: WFL for tasks assessed/performed Overall Cognitive Status: Within Functional Limits for tasks assessed                                        Exercises      General Comments        Pertinent Vitals/Pain Pain Assessment: 0-10 Pain Score: 4  Pain Location: R hip, thigh  Pain Descriptors / Indicators: Aching;Discomfort;Sore Pain Intervention(s): Repositioned;Monitored during session;Limited activity within patient's tolerance    Home Living                      Prior Function            PT Goals (current goals can now be found in the care plan section) Progress towards PT goals: Progressing toward goals    Frequency    7X/week      PT Plan Current plan remains appropriate    Co-evaluation              AM-PAC PT "6 Clicks" Daily Activity  Outcome Measure  Difficulty turning over in bed (including adjusting bedclothes, sheets and blankets)?: A Lot Difficulty moving from lying on back to sitting on the side  of the bed? : A Lot Difficulty sitting down on and standing up from a chair with arms (e.g., wheelchair, bedside commode, etc,.)?: A Lot Help needed moving to and from a bed to chair (including a wheelchair)?: A Little Help needed walking in hospital room?: A Little Help needed climbing 3-5 steps with a railing? : A Little 6 Click Score: 15    End of Session Equipment Utilized During Treatment: Gait belt Activity Tolerance: Patient tolerated treatment well Patient left: in bed;with call bell/phone within reach;with family/visitor present Nurse Communication: Mobility status PT Visit Diagnosis: Difficulty in walking, not elsewhere classified (R26.2);Other abnormalities of gait and mobility (R26.89)      Time: 9147-8295 PT Time Calculation (min) (ACUTE ONLY): 50 min  Charges:  $Gait Training: 23-37 mins                    [name], PT, DPT Acute Rehabilitation Services Office: [phone] Pager: [phone]   Sarajane Jews 11/12/2017, 3:50 PM

## 2017-11-12 NOTE — Care Management Note (Signed)
Case Management Note  Patient Details  Name: [name] MRN: [id number] Date of Birth: 07-May-1980  Subjective/Objective:  Spoke with patient at bedside. She is no longer covered by Medicaid so is concerned about the cost of aftercare. Discussed with office CM, Renee.           Action/Plan: Plan for OPPT at [facility] facility on [facility]. Contacted [facility] to deliver RW to room.  Expected Discharge Date:  11/12/17               Expected Discharge Plan:  OP Rehab  In-House Referral:  NA  Discharge planning Services  CM Consult  Post Acute Care Choice:  NA Choice offered to:  Patient, Spouse  DME Arranged:  Walker rolling DME Agency:  NA, [facility]  HH Arranged:  NA HH Agency:  NA  Status of Service:  Completed, signed off  If discussed at Long Length of Stay Meetings, dates discussed:    Additional Comments:  Alexis Goodell, RN 11/12/2017, 11:39 AM

## 2017-11-26 ENCOUNTER — Ambulatory Visit: Payer: Medicaid Other | Attending: Orthopedic Surgery

## 2017-11-26 ENCOUNTER — Other Ambulatory Visit: Payer: Self-pay

## 2017-11-26 DIAGNOSIS — R293 Abnormal posture: Secondary | ICD-10-CM | POA: Diagnosis present

## 2017-11-26 DIAGNOSIS — M25551 Pain in right hip: Secondary | ICD-10-CM | POA: Diagnosis present

## 2017-11-26 DIAGNOSIS — M6281 Muscle weakness (generalized): Secondary | ICD-10-CM | POA: Diagnosis not present

## 2017-11-26 DIAGNOSIS — R262 Difficulty in walking, not elsewhere classified: Secondary | ICD-10-CM | POA: Insufficient documentation

## 2017-11-26 NOTE — Patient Instructions (Signed)
Supine Daily stretch hip abduction 2 way x 30 sec 2-3 reps 2x/day, bridge, ball squeeze, clam blue band x 10-20 reps daily

## 2017-11-26 NOTE — Therapy (Signed)
[facility] [address] [place], [place], [address] Phone: [phone]   Fax:  [phone]  Physical Therapy Evaluation  Patient Details  Name: [name] MRN: [id number] Date of Birth: 19-Dec-1980 Referring Provider (PT): Gean Birchwood, MD   Encounter Date: 11/26/2017  PT End of Session - 11/26/17 1245    Visit Number  1    Number of Visits  25    Date for PT Re-Evaluation  02/14/18    Authorization Type  self pay    PT Start Time  1145    PT Stop Time  1230    PT Time Calculation (min)  45 min    Activity Tolerance  Patient tolerated treatment well;Patient limited by pain    Behavior During Therapy  [facility] for tasks assessed/performed       Past Medical History:  Diagnosis Date  . Anemia yrs ago  . Anxiety yrs ago  . Arthritis    oa  . Depression yrs ago    Past Surgical History:  Procedure Laterality Date  . TOTAL HIP ARTHROPLASTY Right 11/11/2017   Procedure: RIGHT TOTAL HIP ARTHROPLASTY ANTERIOR APPROACH;  Surgeon: Gean Birchwood, MD;  Location: [facility];  Service: Orthopedics;  Laterality: Right;  . TUBAL LIGATION      There were no vitals filed for this visit.   Subjective Assessment - 11/26/17 1156    Subjective  She reports post RT THA.  Due to AVN.   She report pain and stiffness in leg and back .   She was having trouble walking and standing and sitting.   Since surgery pain better abut now with surgical pain.       Patient is accompained by:  Family member    Limitations  Walking;Standing;Sitting;House hold activities    How long can you sit comfortably?  5 min    How long can you stand comfortably?  , 5 min    How long can you walk comfortably?  houshold distances    Patient Stated Goals  She wants to be able to use leg as before.     Currently in Pain?  Yes    Pain Score  7     Pain Location  Hip    Pain Orientation  Right;Lateral;Anterior   and lwer leg   Pain Descriptors / Indicators   Aching;Discomfort;Dull    Pain Type  Surgical pain    Pain Onset  1 to 4 weeks ago    Pain Frequency  Constant    Aggravating Factors   sit/stand / walk    Pain Relieving Factors  lye and elevate lag, meds         [facility] PT Assessment - 11/26/17 0001      Assessment   Medical Diagnosis  RT ant THA    Referring Provider (PT)  Gean Birchwood, MD    Onset Date/Surgical Date  --   11/11/17   Next MD Visit  01/07/18    Prior Therapy  In hospital      Precautions   Precautions  Anterior Hip    Precaution Comments  She reports limting ER.       Restrictions   Weight Bearing Restrictions  No      Balance Screen   Has the patient fallen in the past 6 months  No    Has the patient had a decrease in activity level because of a fear of falling?   Yes   from  surgery  Home Environment   Living Environment  Private residence    Living Arrangements  Spouse/significant other;Children    Type of Home  House    Home Access  Stairs to enter    Entrance Stairs-Number of Steps  5    Entrance Stairs-Rails  None    Home Layout  Two level    Alternate Level Stairs-Number of Steps  13    Alternate Level Stairs-Rails  Right    Home Equipment  [place] - single point;Walker - 2 wheels      Prior Function   Level of Independence  Requires assistive device for independence;Needs assistance with ADLs;Needs assistance with homemaking      Cognition   Overall Cognitive Status  Within Functional Limits for tasks assessed      Observation/Other Assessments   Focus on Therapeutic Outcomes (FOTO)   50% limited      Posture/Postural Control   Posture Comments  RT ilium higher on RT  in standing      ROM / Strength   AROM / PROM / Strength  AROM;Strength;PROM      AROM   AROM Assessment Site  Hip    Right/Left Hip  Right;Left    Right Hip Extension  0    Right Hip Flexion  80    Right Hip ABduction  10    Left Hip Extension  0    Left Hip Flexion  90    Left Hip ABduction  10      Strength    Overall Strength Comments  RT hip 3/5 to 4-/5   pain with testing   Strength Assessment Site  Knee    Right/Left Knee  Left;Right    Right Knee Flexion  5/5    Right Knee Extension  5/5    Left Knee Flexion  5/5    Left Knee Extension  5/5      Flexibility   Soft Tissue Assessment /Muscle Length  yes    Hamstrings  SLR 45 degrees       Ambulation/Gait   Gait Comments  Walk SPC in RT hand with antalgic gait anddecr weight to RT leg.                 Objective measurements completed on examination: See above findings.      [facility] Adult PT Treatment/Exercise - 11/26/17 0001      Exercises   Exercises  Knee/Hip      Knee/Hip Exercises: Stretches   Other Knee/Hip Stretches  abduction with elgs bent and straight      Knee/Hip Exercises: Supine   Bridges  10 reps    Other Supine Knee/Hip Exercises  ball squeeze and clam with blue band x 10             PT Education - 11/26/17 1244    Education Details  POC HEP, Sugested making block of time during day to sit and walk to progressively incr timetolerance to these activity.  Practice walk with cane in TL hand    Person(s) Educated  Patient;Spouse    Methods  Explanation;Demonstration;Tactile cues;Verbal cues;Handout    Comprehension  Returned demonstration;Verbalized understanding       PT Short Term Goals - 11/26/17 1249      PT SHORT TERM GOAL #1   Title  She will be independent with initial HEP    Time  4    Period  Weeks    Status  New  PT SHORT TERM GOAL #2   Title  She will be able to walk in home without device for RT leg    Time  4    Period  Weeks    Status  New      PT SHORT TERM GOAL #3   Title  She will report pain in RT leg decr 30% or more    Time  4    Period  Weeks    Status  New      PT SHORT TERM GOAL #4   Title  She will report time spent in bed < 1/2 day    Time  4    Period  Weeks    Status  New        PT Long Term Goals - 11/26/17 1251      PT LONG TERM GOAL #1    Title  She will be independnet with all HEP issued    Time  12    Period  Weeks    Status  New      PT LONG TERM GOAL #2   Title  She will report pain as minimal in RT leg with all normal home tasks    Time  12    Period  Weeks    Status  New      PT LONG TERM GOAL #3   Title  She will report no RT leg limits to doing normal home tasks    Time  12    Period  Weeks    Status  New      PT LONG TERM GOAL #4   Title  She will report able to walk stairs in home with min to no RT leg pain    Time  12    Period  Weeks    Status  New      PT LONG TERM GOAL #5   Title  She will not use any device to walk in community for RT leg    Time  12    Period  Weeks    Status  New             Plan - 11/26/17 1246    History and Personal Factors relevant to plan of care:  AVN bilateral, 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 LT hip needs surgery also    PT Frequency  2x / week    PT Duration  12 weeks    PT Treatment/Interventions  Cryotherapy;Moist Heat;Gait training;Stair training;Functional mobility training;Therapeutic activities;Therapeutic exercise;Patient/family education;Manual techniques;Passive range of motion;Balance training    PT Next Visit Plan  progress exrcisces,   modalities as needed    PT Home Exercise Plan  bridge, abduction stretch , pillow squeeze and band clams    Consulted and Agree with Plan of Care  Patient       Patient will benefit from skilled therapeutic intervention in order to improve the following deficits and impairments:  Pain, Difficulty walking, Decreased balance, Decreased activity tolerance, Decreased endurance, Decreased range of motion, Decreased strength, Postural dysfunction  Visit Diagnosis: Muscle weakness  (generalized)  Difficulty in walking, not elsewhere classified  Pain in right hip  Abnormal posture     Problem List Patient Active Problem  List   Diagnosis Date Noted  . Primary osteoarthritis of right hip 11/11/2017  . Osteoarthritis of left hip 11/08/2017    Caprice Red  PT 11/26/2017, 12:59 PM  [facility] [address] [place], [place], [address] Phone: [phone]   Fax:  [phone]  Name: [name] MRN: [id number] Date of Birth: March 23, 1980

## 2017-12-04 ENCOUNTER — Ambulatory Visit: Payer: Medicaid Other

## 2017-12-06 ENCOUNTER — Encounter: Payer: Self-pay | Admitting: Physical Therapy

## 2017-12-06 ENCOUNTER — Ambulatory Visit: Payer: Medicaid Other | Attending: Orthopedic Surgery | Admitting: Physical Therapy

## 2017-12-06 DIAGNOSIS — M6281 Muscle weakness (generalized): Secondary | ICD-10-CM | POA: Insufficient documentation

## 2017-12-06 DIAGNOSIS — R293 Abnormal posture: Secondary | ICD-10-CM | POA: Diagnosis present

## 2017-12-06 DIAGNOSIS — M25551 Pain in right hip: Secondary | ICD-10-CM | POA: Diagnosis present

## 2017-12-06 DIAGNOSIS — R262 Difficulty in walking, not elsewhere classified: Secondary | ICD-10-CM | POA: Diagnosis present

## 2017-12-06 NOTE — Therapy (Addendum)
[place], [place], [address] Phone: [phone]   Fax:  [phone]  Physical Therapy Treatment/Discharge  Patient Details  Name: [name] MRN: [id number] Date of Birth: Sep 03, 1980 Referring Provider (PT): Frederik Pear, MD   Encounter Date: 12/06/2017  PT End of Session - 12/06/17 0932    Visit Number  2    Number of Visits  25    Date for PT Re-Evaluation  02/14/18    Authorization Type  self pay    PT Start Time  0932    PT Stop Time  1016    PT Time Calculation (min)  44 min    Activity Tolerance  Patient tolerated treatment well       Past Medical History:  Diagnosis Date  . Anemia yrs ago  . Anxiety yrs ago  . Arthritis    oa  . Depression yrs ago    Past Surgical History:  Procedure Laterality Date  . TOTAL HIP ARTHROPLASTY Right 11/11/2017   Procedure: RIGHT TOTAL HIP ARTHROPLASTY ANTERIOR APPROACH;  Surgeon: Frederik Pear, MD;  Location: [facility];  Service: Orthopedics;  Laterality: Right;  . TUBAL LIGATION      There were no vitals filed for this visit.  Subjective Assessment - 12/06/17 0935    Subjective  Pt reports she is doing a bit better today as compared to last time she was here.  Reports her left knee is starting to hurt her.     Currently in Pain?  Yes    Pain Score  5     Pain Location  Hip    Pain Orientation  Right    Pain Descriptors / Indicators  Aching    Pain Type  Surgical pain    Pain Radiating Towards  into lateral thigh    Pain Onset  1 to 4 weeks ago    Pain Frequency  Constant    Aggravating Factors   standing and walking    Multiple Pain Sites  Yes    Pain Score  7    Pain Location  Knee    Pain Orientation  Left    Pain Descriptors / Indicators  Aching    Pain Type  Acute pain    Pain Onset  1 to 4 weeks ago    Pain Frequency  Intermittent    Aggravating Factors   walking    Pain Relieving Factors  rest and painmeds         [facility] PT  Assessment - 12/06/17 0001      Assessment   Medical Diagnosis  RT ant THA    Referring Provider (PT)  Frederik Pear, MD    Onset Date/Surgical Date  11/11/17                   [facility] Adult PT Treatment/Exercise - 12/06/17 0001      Ambulation/Gait   Gait Comments  pt is using Lt knee hyperextension during stance phased for stability - explained this was probably causing some of her pain and discussed using cane more and practice keeping a "soft knee"      Exercises   Exercises  Knee/Hip      Knee/Hip Exercises: Aerobic   Nustep  L4x5'      Knee/Hip Exercises: Seated   Long Arc Quad  Strengthening;Both;3 sets;10 reps;Weights    Long Arc Quad Weight  4 lbs.    Sit to Sand  2 sets;10 reps;without UE support  VC to keep wt even on both legs     Knee/Hip Exercises: Supine   Bridges  Strengthening;Both;2 sets;10 reps    Other Supine Knee/Hip Exercises  2x5 reps, 5 sec holds leg lentheners each side      Modalities   Modalities  --   pt to ice at home     Manual Therapy   Manual Therapy  Soft tissue mobilization    Soft tissue mobilization  STM to Rt vastis lateralis, rectus femororis, TFL and ITB. pt very tender to palpation anterior Rt thigh               PT Short Term Goals - 12/06/17 0944      PT SHORT TERM GOAL #1   Title  She will be independent with initial HEP    Status  Achieved      PT SHORT TERM GOAL #2   Title  She will be able to walk in home without device for RT leg    Status  On-going      PT SHORT TERM GOAL #3   Title  She will report pain in RT leg decr 30% or more    Status  On-going      PT SHORT TERM GOAL #4   Title  She will report time spent in bed < 1/2 day    Status  On-going   pt reports she is working on moving and doing more at home       PT [place] - 12/06/17 0944      PT LONG TERM GOAL #1   Title  She will be independnet with all HEP issued    Status  On-going      PT LONG TERM GOAL #2   Title  She  will report pain as minimal in RT leg with all normal home tasks    Status  On-going      PT LONG TERM GOAL #3   Title  She will report no RT leg limits to doing normal home tasks    Status  On-going      PT LONG TERM GOAL #4   Title  She will report able to walk stairs in home with min to no RT leg pain    Status  On-going      PT LONG TERM GOAL #5   Title  She will not use any device to walk in community for RT leg    Status  On-going            Plan - 12/06/17 1008    Clinical Impression Statement  This is [name]'s second, she has a lot of pain into her Rt quad and new onset of Lt knee pain.  She does use Lt knee hyperextension in stance phase for stabilization and would benefit from continued lower body strengthening to address both sides as she may need her Lt hip replaced as well.  Pt also reports some LBP, and would benefit from core work as well.  She has a lot of palpable tightness in the Rt quad and around the incision, needs desensitization work to this.     Rehab Potential  Good    Clinical Impairments Affecting Rehab Potential  chronicity of issue . LT hip needs surgery also    PT Frequency  2x / week    PT Duration  12 weeks    PT Treatment/Interventions  Cryotherapy;Moist Heat;Gait training;Stair training;Functional mobility training;Therapeutic activities;Therapeutic exercise;Patient/family education;Manual techniques;Passive  range of motion;Balance training    PT Next Visit Plan  progress exrcisces,   modalities as needed    Consulted and Agree with Plan of Care  Patient       Patient will benefit from skilled therapeutic intervention in order to improve the following deficits and impairments:  Pain, Difficulty walking, Decreased balance, Decreased activity tolerance, Decreased endurance, Decreased range of motion, Decreased strength, Postural dysfunction  Visit Diagnosis: Muscle weakness (generalized)  Difficulty in walking, not elsewhere classified  Pain  in right hip  Abnormal posture     Problem List Patient Active Problem List   Diagnosis Date Noted  . Primary osteoarthritis of right hip 11/11/2017  . Osteoarthritis of left hip 11/08/2017    [name] rPT  12/06/2017, 10:17 AM  [facility] [address] [place], [place], [address] Phone: [phone]   Fax:  [phone]  Name: [name] MRN: [id number] Date of Birth: [birth year]  PHYSICAL THERAPY DISCHARGE SUMMARY  Visits from Start of Care:2  Current functional level related to goals / functional outcomes: Unknown, pt has not returned for tx and is scheduled to have a Lt THA   Remaining deficits: unknown   Education / Equipment: HEP Plan:                                                    Patient goals were not met. Patient is being discharged due to not returning since the last visit.  ?????    Jeral Pinch, PT 01/15/18 12:53 PM

## 2017-12-09 ENCOUNTER — Ambulatory Visit: Payer: Medicaid Other | Admitting: Physical Therapy

## 2017-12-11 ENCOUNTER — Ambulatory Visit: Payer: Medicaid Other

## 2017-12-16 ENCOUNTER — Ambulatory Visit: Payer: Medicaid Other | Admitting: Physical Therapy

## 2017-12-16 ENCOUNTER — Telehealth: Payer: Self-pay | Admitting: Physical Therapy

## 2017-12-16 NOTE — Telephone Encounter (Signed)
Called pt regarding missing todays appointment, she apologized and said she is not feeling well.  She requested to cancel Wed appointment as well as she doesn't think she will be better by then.  She plans on returning next Monday at her scheduled appointment.

## 2017-12-18 ENCOUNTER — Ambulatory Visit: Payer: Medicaid Other

## 2017-12-23 ENCOUNTER — Ambulatory Visit: Payer: Medicaid Other

## 2017-12-26 ENCOUNTER — Ambulatory Visit: Payer: Medicaid Other

## 2018-02-12 ENCOUNTER — Other Ambulatory Visit: Payer: Self-pay | Admitting: Orthopedic Surgery

## 2018-02-24 NOTE — Progress Notes (Signed)
EKG, CXR 11-04-17 epic

## 2018-02-24 NOTE — Patient Instructions (Addendum)
[name]  02/24/2018   Your procedure is scheduled on: 03-03-2018    Report to [facility] Main  Entrance     Report to admitting at 5:30AM    Call this number if you have problems the morning of surgery [phone]      Remember: Do not eat food or drink liquids :After Midnight. BRUSH YOUR TEETH MORNING OF SURGERY AND RINSE YOUR MOUTH OUT, NO CHEWING GUM CANDY OR MINTS.     Take these medicines the morning of surgery with A SIP OF WATER: tylenol if needed, tramadol if needed                                You may not have any metal on your body including hair pins and              piercings  Do not wear jewelry, make-up, lotions, powders or perfumes, deodorant             Do not wear nail polish.  Do not shave  48 hours prior to surgery.                Do not bring valuables to the hospital. [facility] [facility] IS NOT             RESPONSIBLE   FOR VALUABLES.  Contacts, dentures or bridgework may not be worn into surgery.  Leave suitcase in the car. After surgery it may be brought to your room.                   Please read over the following fact sheets you were given: _____________________________________________________________________             [facility] - Preparing for Surgery Before surgery, you can play an important role.  Because skin is not sterile, your skin needs to be as free of germs as possible.  You can reduce the number of germs on your skin by washing with CHG (chlorahexidine gluconate) soap before surgery.  CHG is an antiseptic cleaner which kills germs and bonds with the skin to continue killing germs even after washing. Please DO NOT use if you have an allergy to CHG or antibacterial soaps.  If your skin becomes reddened/irritated stop using the CHG and inform your nurse when you arrive at Short Stay. Do not shave (including legs and underarms) for at least 48 hours prior to the first CHG shower.  You may shave your  face/neck. Please follow these instructions carefully:  1.  Shower with CHG Soap the night before surgery and the  morning of Surgery.  2.  If you choose to wash your hair, wash your hair first as usual with your  normal  shampoo.  3.  After you shampoo, rinse your hair and body thoroughly to remove the  shampoo.                           4.  Use CHG as you would any other liquid soap.  You can apply chg directly  to the skin and wash                       Gently with a scrungie or clean washcloth.  5.  Apply the CHG Soap to your  body ONLY FROM THE NECK DOWN.   Do not use on face/ open                           Wound or open sores. Avoid contact with eyes, ears mouth and genitals (private parts).                       Wash face,  Genitals (private parts) with your normal soap.             6.  Wash thoroughly, paying special attention to the area where your surgery  will be performed.  7.  Thoroughly rinse your body with warm water from the neck down.  8.  DO NOT shower/wash with your normal soap after using and rinsing off  the CHG Soap.                9.  Pat yourself dry with a clean towel.            10.  Wear clean pajamas.            11.  Place clean sheets on your bed the night of your first shower and do not  sleep with pets. Day of Surgery : Do not apply any lotions/deodorants the morning of surgery.  Please wear clean clothes to the hospital/surgery center.  FAILURE TO FOLLOW THESE INSTRUCTIONS MAY RESULT IN THE CANCELLATION OF YOUR SURGERY PATIENT SIGNATURE_________________________________  NURSE SIGNATURE__________________________________  ________________________________________________________________________   Adam Phenix  An incentive spirometer is a tool that can help keep your lungs clear and active. This tool measures how well you are filling your lungs with each breath. Taking long deep breaths may help reverse or decrease the chance of developing breathing  (pulmonary) problems (especially infection) following:  A long period of time when you are unable to move or be active. BEFORE THE PROCEDURE   If the spirometer includes an indicator to show your best effort, your nurse or respiratory therapist will set it to a desired goal.  If possible, sit up straight or lean slightly forward. Try not to slouch.  Hold the incentive spirometer in an upright position. INSTRUCTIONS FOR USE  1. Sit on the edge of your bed if possible, or sit up as far as you can in bed or on a chair. 2. Hold the incentive spirometer in an upright position. 3. Breathe out normally. 4. Place the mouthpiece in your mouth and seal your lips tightly around it. 5. Breathe in slowly and as deeply as possible, raising the piston or the ball toward the top of the column. 6. Hold your breath for 3-5 seconds or for as long as possible. Allow the piston or ball to fall to the bottom of the column. 7. Remove the mouthpiece from your mouth and breathe out normally. 8. Rest for a few seconds and repeat Steps 1 through 7 at least 10 times every 1-2 hours when you are awake. Take your time and take a few normal breaths between deep breaths. 9. The spirometer may include an indicator to show your best effort. Use the indicator as a goal to work toward during each repetition. 10. After each set of 10 deep breaths, practice coughing to be sure your lungs are clear. If you have an incision (the cut made at the time of surgery), support your incision when coughing by placing a pillow or rolled up towels firmly against it.  Once you are able to get out of bed, walk around indoors and cough well. You may stop using the incentive spirometer when instructed by your caregiver.  RISKS AND COMPLICATIONS  Take your time so you do not get dizzy or light-headed.  If you are in pain, you may need to take or ask for pain medication before doing incentive spirometry. It is harder to take a deep breath if you  are having pain. AFTER USE  Rest and breathe slowly and easily.  It can be helpful to keep track of a log of your progress. Your caregiver can provide you with a simple table to help with this. If you are using the spirometer at home, follow these instructions: [place] IF:   You are having difficultly using the spirometer.  You have trouble using the spirometer as often as instructed.  Your pain medication is not giving enough relief while using the spirometer.  You develop fever of 100.5 F (38.1 C) or higher. SEEK IMMEDIATE MEDICAL CARE IF:   You cough up bloody sputum that had not been present before.  You develop fever of 102 F (38.9 C) or greater.  You develop worsening pain at or near the incision site. MAKE SURE YOU:   Understand these instructions.  Will watch your condition.  Will get help right away if you are not doing well or get worse. Document Released: 06/04/2006 Document Revised: 04/16/2011 Document Reviewed: 08/05/2006 [facility] Patient Information 2014 [place], [place].   ________________________________________________________________________

## 2018-02-25 ENCOUNTER — Other Ambulatory Visit: Payer: Self-pay | Admitting: Orthopaedic Surgery

## 2018-02-25 ENCOUNTER — Other Ambulatory Visit: Payer: Self-pay

## 2018-02-25 ENCOUNTER — Encounter (HOSPITAL_COMMUNITY)
Admission: RE | Admit: 2018-02-25 | Discharge: 2018-02-25 | Disposition: A | Discharge status: Home / Self Care | Payer: Medicaid Other | Source: Ambulatory Visit | Attending: Orthopaedic Surgery | Admitting: Orthopaedic Surgery

## 2018-02-25 ENCOUNTER — Encounter (HOSPITAL_COMMUNITY): Payer: Self-pay

## 2018-02-25 DIAGNOSIS — Z01812 Encounter for preprocedural laboratory examination: Secondary | ICD-10-CM | POA: Insufficient documentation

## 2018-02-25 DIAGNOSIS — M1612 Unilateral primary osteoarthritis, left hip: Secondary | ICD-10-CM | POA: Diagnosis not present

## 2018-02-25 LAB — CBC WITH DIFFERENTIAL/PLATELET
Abs Immature Granulocytes: 0.03 10*3/uL (ref 0.00–0.07)
Basophils Absolute: 0.1 10*3/uL (ref 0.0–0.1)
Basophils Relative: 1 %
Eosinophils Absolute: 0.2 10*3/uL (ref 0.0–0.5)
Eosinophils Relative: 3 %
HCT: 39.1 % (ref 36.0–46.0)
Hemoglobin: 13.2 g/dL (ref 12.0–15.0)
Immature Granulocytes: 0 %
Lymphocytes Relative: 36 %
Lymphs Abs: 3.1 10*3/uL (ref 0.7–4.0)
MCH: 34.3 pg — ABNORMAL HIGH (ref 26.0–34.0)
MCHC: 33.8 g/dL (ref 30.0–36.0)
MCV: 101.6 fL — ABNORMAL HIGH (ref 80.0–100.0)
Monocytes Absolute: 0.7 10*3/uL (ref 0.1–1.0)
Monocytes Relative: 8 %
Neutro Abs: 4.6 10*3/uL (ref 1.7–7.7)
Neutrophils Relative %: 52 %
Platelets: 322 10*3/uL (ref 150–400)
RBC: 3.85 MIL/uL — ABNORMAL LOW (ref 3.87–5.11)
RDW: 13.8 % (ref 11.5–15.5)
WBC: 8.7 10*3/uL (ref 4.0–10.5)
nRBC: 0 % (ref 0.0–0.2)

## 2018-02-25 LAB — URINALYSIS, ROUTINE W REFLEX MICROSCOPIC
Bilirubin Urine: NEGATIVE
GLUCOSE, UA: NEGATIVE mg/dL
Hgb urine dipstick: NEGATIVE
Ketones, ur: NEGATIVE mg/dL
Leukocytes, UA: NEGATIVE
Nitrite: NEGATIVE
Protein, ur: NEGATIVE mg/dL
Specific Gravity, Urine: 1.017 (ref 1.005–1.030)
pH: 5 (ref 5.0–8.0)

## 2018-02-25 LAB — SURGICAL PCR SCREEN
MRSA, PCR: NEGATIVE
Staphylococcus aureus: NEGATIVE

## 2018-02-25 LAB — BASIC METABOLIC PANEL
Anion gap: 7 (ref 5–15)
BUN: 8 mg/dL (ref 6–20)
CO2: 27 mmol/L (ref 22–32)
Calcium: 9 mg/dL (ref 8.9–10.3)
Chloride: 103 mmol/L (ref 98–111)
Creatinine, Ser: 0.73 mg/dL (ref 0.44–1.00)
GFR calc Af Amer: >60 mL/min (ref >60)
GFR calc non Af Amer: >60 mL/min (ref >60)
Glucose, Bld: 78 mg/dL (ref 70–99)
Potassium: 3.6 mmol/L (ref 3.5–5.1)
SODIUM: 137 mmol/L (ref 135–145)

## 2018-02-25 LAB — PREGNANCY, URINE: Preg Test, Ur: NEGATIVE

## 2018-02-25 LAB — PROTIME-INR
INR: 0.97
Prothrombin Time: 12.8 seconds (ref 11.4–15.2)

## 2018-02-25 LAB — APTT: aPTT: 33 seconds (ref 24–36)

## 2018-02-25 NOTE — Progress Notes (Signed)
Per patient , Dr Turner Daniels out on medical leave and Dr Jerl Santos will now being doing her surgery. Consent inepic still lists [facility] as Careers adviser. RN f/u with Beverly,RN.  Per Christean Grief working on getting the consent order in epic corrected.     Patient was sent to do urine sample and bloodwork while waiting for consent order to be placed in epic. Patient finished giving urine and blood and waited an additional 10 minutes for orders. No orders in epic therefore patient left after agreed to sign her consent the day of surgery.

## 2018-02-27 NOTE — H&P (Signed)
TOTAL HIP ADMISSION H&P  Patient is admitted for left total hip arthroplasty.  Subjective:  Chief Complaint: left hip pain  HPI: [name], 38 y.o. female, has a history of pain and functional disability in the left hip(s) due to arthritis and patient has failed non-surgical conservative treatments for greater than 12 weeks to include NSAID's and/or analgesics, corticosteriod injections, flexibility and strengthening excercises, supervised PT with diminished ADL's post treatment, use of assistive devices, weight reduction as appropriate and activity modification.  Onset of symptoms was gradual starting 5 years ago with gradually worsening course since that time.The patient noted no past surgery on the left hip(s).  Patient currently rates pain in the left hip at 10 out of 10 with activity. Patient has night pain, worsening of pain with activity and weight bearing, trendelenberg gait, pain that interfers with activities of daily living and crepitus. Patient has evidence of subchondral cysts, subchondral sclerosis, periarticular osteophytes and joint space narrowing by imaging studies. This condition presents safety issues increasing the risk of falls. There is no current active infection.  Patient Active Problem List   Diagnosis Date Noted  . Primary osteoarthritis of right hip 11/11/2017  . Osteoarthritis of left hip 11/08/2017   Past Medical History:  Diagnosis Date  . Anemia yrs ago  . Anxiety yrs ago  . Arthritis    oa  . Depression yrs ago    Past Surgical History:  Procedure Laterality Date  . TOTAL HIP ARTHROPLASTY Right 11/11/2017   Procedure: RIGHT TOTAL HIP ARTHROPLASTY ANTERIOR APPROACH;  Surgeon: Gean Birchwood, MD;  Location: [facility];  Service: Orthopedics;  Laterality: Right;  . TUBAL LIGATION      No current facility-administered medications for this encounter.    Current Outpatient Medications  Medication Sig Dispense Refill Last Dose  . acetaminophen (TYLENOL) 500  MG tablet Take 1,000 mg by mouth every 6 (six) hours as needed (for pain.).     Marland Kitchen calcium carbonate (OSCAL) 1500 (600 Ca) MG TABS tablet Take 600 mg by mouth every 3 (three) days.     Marland Kitchen ibuprofen (ADVIL,MOTRIN) 200 MG tablet Take 600 mg by mouth every 6 (six) hours as needed (for pain.).     Marland Kitchen traMADol (ULTRAM) 50 MG tablet Take 50 mg by mouth every 6 (six) hours as needed (pain).    Taking  . vitamin B-12 (CYANOCOBALAMIN) 500 MCG tablet Take 500 mcg by mouth every 3 (three) days.     . vitamin E 400 UNIT capsule Take 400 Units by mouth every 3 (three) days.     Marland Kitchen aspirin EC 81 MG tablet Take 1 tablet (81 mg total) by mouth 2 (two) times daily. (Patient not taking: Reported on 11/26/2017) 60 tablet 0 Not Taking  . oxyCODONE-acetaminophen (PERCOCET/ROXICET) 5-325 MG tablet Take 1 tablet by mouth every 4 (four) hours as needed for severe pain. (Patient not taking: Reported on 11/26/2017) 30 tablet 0 Not Taking  . tiZANidine (ZANAFLEX) 2 MG tablet Take 1 tablet (2 mg total) by mouth every 6 (six) hours as needed. (Patient not taking: Reported on 02/14/2018) 60 tablet 0 Not Taking at Unknown time   No Known Allergies  Social History   Tobacco Use  . Smoking status: Current Every Day Smoker    Packs/day: 0.25    Years: 24.00    Pack years: 6.00    Types: Cigarettes  . Smokeless tobacco: Never Used  Substance Use Topics  . Alcohol use: Not Currently    No family history  on file.   Review of Systems  Musculoskeletal: Positive for joint pain.       Left hip  All other systems reviewed and are negative.   Objective:  Physical Exam  Constitutional: She is oriented to person, place, and time. She appears well-developed and well-nourished.  HENT:  Head: Normocephalic and atraumatic.  Eyes: Pupils are equal, round, and reactive to light.  Neck: Normal range of motion.  Cardiovascular: Normal rate.  Respiratory: Effort normal.  GI: Soft.  Musculoskeletal:     Comments: Left hip motion is  limited and painful.  Her leg lengths are equal.  She is walking with an altered gait using her cane.  On the right she has a well-healed scar.    Neurological: She is alert and oriented to person, place, and time.  Skin: Skin is warm and dry.  Psychiatric: She has a normal mood and affect. Her behavior is normal. Judgment and thought content normal.    Vital signs in last 24 hours:    Labs:   Estimated body mass index is 36.9 kg/m as calculated from the following:   Height as of 11/11/17: 5' 2.5" (1.588 m).   Weight as of 11/11/17: 93 kg.   Imaging Review Plain radiographs demonstrate severe degenerative joint disease of the left hip(s). The bone quality appears to be good for age and reported activity level.    Preoperative templating of the joint replacement has been completed, documented, and submitted to the Operating Room personnel in order to optimize intra-operative equipment management.     Assessment/Plan:  End stage primary arthritis, left hip(s)  The patient history, physical examination, clinical judgement of the provider and imaging studies are consistent with end stage degenerative joint disease of the left hip(s) and total hip arthroplasty is deemed medically necessary. The treatment options including medical management, injection therapy, arthroscopy and arthroplasty were discussed at length. The risks and benefits of total hip arthroplasty were presented and reviewed. The risks due to aseptic loosening, infection, stiffness, dislocation/subluxation,  thromboembolic complications and other imponderables were discussed.  The patient acknowledged the explanation, agreed to proceed with the plan and consent was signed. Patient is being admitted for inpatient treatment for surgery, pain control, PT, OT, prophylactic antibiotics, VTE prophylaxis, progressive ambulation and ADL's and discharge planning.The patient is planning to be discharged home with home health services

## 2018-02-28 NOTE — Anesthesia Preprocedure Evaluation (Addendum)
Anesthesia Evaluation  Patient identified by MRN, date of birth, ID band Patient awake    Reviewed: Allergy & Precautions, NPO status , Patient's Chart, lab work & pertinent test results  History of Anesthesia Complications Negative for: history of anesthetic complications  Airway Mallampati: II  TM Distance: >3 FB Neck ROM: Full    Dental  (+) Teeth Intact, Dental Advisory Given   Pulmonary Current Smoker,    Pulmonary exam normal breath sounds clear to auscultation       Cardiovascular negative cardio ROS Normal cardiovascular exam Rhythm:Regular Rate:Normal     Neuro/Psych negative neurological ROS     GI/Hepatic negative GI ROS, Neg liver ROS,   Endo/Other  negative endocrine ROS  Renal/GU negative Renal ROS     Musculoskeletal  (+) Arthritis ,   Abdominal   Peds  Hematology negative hematology ROS (+)   Anesthesia Other Findings Day of surgery medications reviewed with the patient.  Reproductive/Obstetrics                            Anesthesia Physical Anesthesia Plan  ASA: II  Anesthesia Plan: Spinal   Post-op Pain Management:    Induction:   PONV Risk Score and Plan: 1 and Treatment may vary due to age or medical condition, Ondansetron, Propofol infusion and Dexamethasone  Airway Management Planned: Natural Airway and Simple Face Mask  Additional Equipment:   Intra-op Plan:   Post-operative Plan:   Informed Consent: I have reviewed the patients History and Physical, chart, labs and discussed the procedure including the risks, benefits and alternatives for the proposed anesthesia with the patient or authorized representative who has indicated his/her understanding and acceptance.     Dental advisory given  Plan Discussed with: CRNA  Anesthesia Plan Comments:       Anesthesia Quick Evaluation

## 2018-03-02 MED ORDER — TRANEXAMIC ACID 1000 MG/10ML IV SOLN
2000.0000 mg | INTRAVENOUS | Status: DC
  Filled 2018-03-02: qty 20

## 2018-03-02 MED ORDER — BUPIVACAINE LIPOSOME 1.3 % IJ SUSP
10.0000 mL | Freq: Once | INTRAMUSCULAR | Status: DC
  Filled 2018-03-02: qty 10

## 2018-03-03 ENCOUNTER — Ambulatory Visit (HOSPITAL_COMMUNITY): Payer: Medicaid Other | Admitting: Anesthesiology

## 2018-03-03 ENCOUNTER — Other Ambulatory Visit: Payer: Self-pay

## 2018-03-03 ENCOUNTER — Observation Stay (HOSPITAL_COMMUNITY)
Admission: RE | Admit: 2018-03-03 | Discharge: 2018-03-04 | Disposition: A | Discharge status: Home / Self Care | Payer: Medicaid Other | Attending: Orthopaedic Surgery | Admitting: Orthopaedic Surgery

## 2018-03-03 ENCOUNTER — Encounter (HOSPITAL_COMMUNITY)
Admission: RE | Disposition: A | Discharge status: Home / Self Care | Payer: Self-pay | Source: Home / Self Care | Attending: Orthopaedic Surgery

## 2018-03-03 ENCOUNTER — Ambulatory Visit (HOSPITAL_COMMUNITY): Payer: Medicaid Other

## 2018-03-03 ENCOUNTER — Ambulatory Visit (HOSPITAL_COMMUNITY): Payer: Medicaid Other | Admitting: Physician Assistant

## 2018-03-03 ENCOUNTER — Encounter (HOSPITAL_COMMUNITY): Payer: Self-pay

## 2018-03-03 DIAGNOSIS — E669 Obesity, unspecified: Secondary | ICD-10-CM | POA: Diagnosis not present

## 2018-03-03 DIAGNOSIS — Z419 Encounter for procedure for purposes other than remedying health state, unspecified: Secondary | ICD-10-CM

## 2018-03-03 DIAGNOSIS — D62 Acute posthemorrhagic anemia: Secondary | ICD-10-CM | POA: Diagnosis not present

## 2018-03-03 DIAGNOSIS — M1612 Unilateral primary osteoarthritis, left hip: Secondary | ICD-10-CM | POA: Diagnosis present

## 2018-03-03 DIAGNOSIS — Z79899 Other long term (current) drug therapy: Secondary | ICD-10-CM | POA: Insufficient documentation

## 2018-03-03 DIAGNOSIS — F17210 Nicotine dependence, cigarettes, uncomplicated: Secondary | ICD-10-CM | POA: Insufficient documentation

## 2018-03-03 DIAGNOSIS — Z6838 Body mass index (BMI) 38.0-38.9, adult: Secondary | ICD-10-CM | POA: Diagnosis not present

## 2018-03-03 HISTORY — PX: TOTAL HIP ARTHROPLASTY: SHX124

## 2018-03-03 LAB — TYPE AND SCREEN
ABO/RH(D): O POS
Antibody Screen: NEGATIVE

## 2018-03-03 SURGERY — ARTHROPLASTY, HIP, TOTAL, ANTERIOR APPROACH
Anesthesia: Spinal | Site: Hip | Laterality: Left

## 2018-03-03 MED ORDER — METHOCARBAMOL 1000 MG/10ML IJ SOLN
500.0000 mg | Freq: Four times a day (QID) | INTRAVENOUS | Status: DC | PRN
  Filled 2018-03-03: qty 5

## 2018-03-03 MED ORDER — PHENYLEPHRINE 40 MCG/ML (10ML) SYRINGE FOR IV PUSH (FOR BLOOD PRESSURE SUPPORT)
PREFILLED_SYRINGE | INTRAVENOUS | Status: AC
  Filled 2018-03-03: qty 10

## 2018-03-03 MED ORDER — PROPOFOL 500 MG/50ML IV EMUL
INTRAVENOUS | Status: DC | PRN
  Administered 2018-03-03: 75 ug/kg/min via INTRAVENOUS

## 2018-03-03 MED ORDER — BUPIVACAINE-EPINEPHRINE (PF) 0.25% -1:200000 IJ SOLN
INTRAMUSCULAR | Status: DC | PRN
  Administered 2018-03-03: 30 mL

## 2018-03-03 MED ORDER — MEPERIDINE HCL 50 MG/ML IJ SOLN
6.2500 mg | INTRAMUSCULAR | Status: DC | PRN
  Administered 2018-03-03: 6.25 mg via INTRAVENOUS

## 2018-03-03 MED ORDER — METOCLOPRAMIDE HCL 5 MG PO TABS: 5.0000 mg | ORAL_TABLET | Freq: Three times a day (TID) | ORAL | Status: DC | PRN

## 2018-03-03 MED ORDER — METHOCARBAMOL 500 MG PO TABS
500.0000 mg | ORAL_TABLET | Freq: Four times a day (QID) | ORAL | Status: DC | PRN
  Administered 2018-03-03 – 2018-03-04 (×3): 500 mg via ORAL
  Filled 2018-03-03 (×3): qty 1

## 2018-03-03 MED ORDER — BUPIVACAINE IN DEXTROSE 0.75-8.25 % IT SOLN
INTRATHECAL | Status: DC | PRN
  Administered 2018-03-03: 1.8 mL via INTRATHECAL

## 2018-03-03 MED ORDER — CEFAZOLIN SODIUM-DEXTROSE 2-4 GM/100ML-% IV SOLN
2.0000 g | INTRAVENOUS | Status: AC
  Administered 2018-03-03: 2 g via INTRAVENOUS
  Filled 2018-03-03: qty 100

## 2018-03-03 MED ORDER — ONDANSETRON HCL 4 MG PO TABS: 4.0000 mg | ORAL_TABLET | Freq: Four times a day (QID) | ORAL | Status: DC | PRN

## 2018-03-03 MED ORDER — BUPIVACAINE LIPOSOME 1.3 % IJ SUSP
INTRAMUSCULAR | Status: DC | PRN
  Administered 2018-03-03: 10 mL

## 2018-03-03 MED ORDER — PHENYLEPHRINE 40 MCG/ML (10ML) SYRINGE FOR IV PUSH (FOR BLOOD PRESSURE SUPPORT)
PREFILLED_SYRINGE | INTRAVENOUS | Status: DC | PRN
  Administered 2018-03-03 (×6): 40 ug via INTRAVENOUS
  Administered 2018-03-03: 80 ug via INTRAVENOUS
  Administered 2018-03-03 (×5): 40 ug via INTRAVENOUS

## 2018-03-03 MED ORDER — ONDANSETRON HCL 4 MG/2ML IJ SOLN: 4.0000 mg | Freq: Four times a day (QID) | INTRAMUSCULAR | Status: DC | PRN

## 2018-03-03 MED ORDER — PROPOFOL 10 MG/ML IV BOLUS
INTRAVENOUS | Status: AC
  Filled 2018-03-03: qty 80

## 2018-03-03 MED ORDER — HYDROMORPHONE HCL 1 MG/ML IJ SOLN
0.5000 mg | INTRAMUSCULAR | Status: DC | PRN
  Administered 2018-03-03: 1 mg via INTRAVENOUS
  Filled 2018-03-03: qty 1

## 2018-03-03 MED ORDER — PROMETHAZINE HCL 25 MG/ML IJ SOLN: 6.2500 mg | INTRAMUSCULAR | Status: DC | PRN

## 2018-03-03 MED ORDER — BUPIVACAINE-EPINEPHRINE (PF) 0.25% -1:200000 IJ SOLN
INTRAMUSCULAR | Status: AC
  Filled 2018-03-03: qty 30

## 2018-03-03 MED ORDER — ACETAMINOPHEN 325 MG PO TABS: 325.0000 mg | ORAL_TABLET | Freq: Four times a day (QID) | ORAL | Status: DC | PRN

## 2018-03-03 MED ORDER — ALUM & MAG HYDROXIDE-SIMETH 200-200-20 MG/5ML PO SUSP: 30.0000 mL | ORAL | Status: DC | PRN

## 2018-03-03 MED ORDER — KETOROLAC TROMETHAMINE 15 MG/ML IJ SOLN
15.0000 mg | Freq: Four times a day (QID) | INTRAMUSCULAR | Status: AC
  Administered 2018-03-03 – 2018-03-04 (×4): 15 mg via INTRAVENOUS
  Filled 2018-03-03 (×4): qty 1

## 2018-03-03 MED ORDER — FENTANYL CITRATE (PF) 100 MCG/2ML IJ SOLN
INTRAMUSCULAR | Status: DC | PRN
  Administered 2018-03-03: 100 ug via INTRAVENOUS

## 2018-03-03 MED ORDER — ASPIRIN EC 325 MG PO TBEC
325.0000 mg | DELAYED_RELEASE_TABLET | Freq: Two times a day (BID) | ORAL | Status: DC
  Administered 2018-03-04: 325 mg via ORAL
  Filled 2018-03-03: qty 1

## 2018-03-03 MED ORDER — DOCUSATE SODIUM 100 MG PO CAPS
100.0000 mg | ORAL_CAPSULE | Freq: Two times a day (BID) | ORAL | Status: DC
  Administered 2018-03-03 – 2018-03-04 (×2): 100 mg via ORAL
  Filled 2018-03-03 (×2): qty 1

## 2018-03-03 MED ORDER — MEPERIDINE HCL 50 MG/ML IJ SOLN
INTRAMUSCULAR | Status: AC
  Filled 2018-03-03: qty 1

## 2018-03-03 MED ORDER — DEXAMETHASONE SODIUM PHOSPHATE 10 MG/ML IJ SOLN
INTRAMUSCULAR | Status: DC | PRN
  Administered 2018-03-03: 10 mg via INTRAVENOUS

## 2018-03-03 MED ORDER — TRANEXAMIC ACID-NACL 1000-0.7 MG/100ML-% IV SOLN
1000.0000 mg | INTRAVENOUS | Status: AC
  Administered 2018-03-03: 1000 mg via INTRAVENOUS
  Filled 2018-03-03: qty 100

## 2018-03-03 MED ORDER — PROPOFOL 10 MG/ML IV BOLUS
INTRAVENOUS | Status: DC | PRN
  Administered 2018-03-03: 30 mg via INTRAVENOUS
  Administered 2018-03-03: 10 mg via INTRAVENOUS

## 2018-03-03 MED ORDER — OXYCODONE HCL 5 MG PO TABS: 5.0000 mg | ORAL_TABLET | Freq: Once | ORAL | Status: DC | PRN

## 2018-03-03 MED ORDER — FENTANYL CITRATE (PF) 100 MCG/2ML IJ SOLN
INTRAMUSCULAR | Status: AC
  Filled 2018-03-03: qty 2

## 2018-03-03 MED ORDER — LACTATED RINGERS IV SOLN
INTRAVENOUS | Status: DC
  Administered 2018-03-03 – 2018-03-04 (×4): via INTRAVENOUS

## 2018-03-03 MED ORDER — CHLORHEXIDINE GLUCONATE 4 % EX LIQD: 60.0000 mL | Freq: Once | CUTANEOUS | Status: DC

## 2018-03-03 MED ORDER — LACTATED RINGERS IV SOLN
INTRAVENOUS | Status: DC
  Administered 2018-03-03 (×2): via INTRAVENOUS

## 2018-03-03 MED ORDER — STERILE WATER FOR IRRIGATION IR SOLN: Freq: Once | Status: DC

## 2018-03-03 MED ORDER — METOCLOPRAMIDE HCL 5 MG/ML IJ SOLN: 5.0000 mg | Freq: Three times a day (TID) | INTRAMUSCULAR | Status: DC | PRN

## 2018-03-03 MED ORDER — DEXAMETHASONE SODIUM PHOSPHATE 10 MG/ML IJ SOLN
INTRAMUSCULAR | Status: AC
  Filled 2018-03-03: qty 3

## 2018-03-03 MED ORDER — ONDANSETRON HCL 4 MG/2ML IJ SOLN
INTRAMUSCULAR | Status: AC
  Filled 2018-03-03: qty 6

## 2018-03-03 MED ORDER — ROCURONIUM BROMIDE 100 MG/10ML IV SOLN
INTRAVENOUS | Status: AC
  Filled 2018-03-03: qty 1

## 2018-03-03 MED ORDER — FENTANYL CITRATE (PF) 100 MCG/2ML IJ SOLN: 25.0000 ug | INTRAMUSCULAR | Status: DC | PRN

## 2018-03-03 MED ORDER — PHENOL 1.4 % MT LIQD
1.0000 | OROMUCOSAL | Status: DC | PRN
  Filled 2018-03-03: qty 177

## 2018-03-03 MED ORDER — CEFAZOLIN SODIUM-DEXTROSE 2-4 GM/100ML-% IV SOLN
2.0000 g | Freq: Four times a day (QID) | INTRAVENOUS | Status: AC
  Administered 2018-03-03 (×2): 2 g via INTRAVENOUS
  Filled 2018-03-03 (×2): qty 100

## 2018-03-03 MED ORDER — SODIUM CHLORIDE 0.9 % IV BOLUS
500.0000 mL | Freq: Once | INTRAVENOUS | Status: AC
  Administered 2018-03-03: 500 mL via INTRAVENOUS

## 2018-03-03 MED ORDER — ACETAMINOPHEN 500 MG PO TABS
1000.0000 mg | ORAL_TABLET | Freq: Four times a day (QID) | ORAL | Status: AC
  Administered 2018-03-03 – 2018-03-04 (×4): 1000 mg via ORAL
  Filled 2018-03-03 (×4): qty 2

## 2018-03-03 MED ORDER — ONDANSETRON HCL 4 MG/2ML IJ SOLN
INTRAMUSCULAR | Status: DC | PRN
  Administered 2018-03-03: 4 mg via INTRAVENOUS

## 2018-03-03 MED ORDER — 0.9 % SODIUM CHLORIDE (POUR BTL) OPTIME
TOPICAL | Status: DC | PRN
  Administered 2018-03-03: 1000 mL

## 2018-03-03 MED ORDER — ACETAMINOPHEN 10 MG/ML IV SOLN: 1000.0000 mg | Freq: Once | INTRAVENOUS | Status: DC | PRN

## 2018-03-03 MED ORDER — DIPHENHYDRAMINE HCL 12.5 MG/5ML PO ELIX: 12.5000 mg | ORAL_SOLUTION | ORAL | Status: DC | PRN

## 2018-03-03 MED ORDER — MIDAZOLAM HCL 2 MG/2ML IJ SOLN
INTRAMUSCULAR | Status: AC
  Filled 2018-03-03: qty 2

## 2018-03-03 MED ORDER — OXYCODONE HCL 5 MG PO TABS
5.0000 mg | ORAL_TABLET | ORAL | Status: DC | PRN
  Administered 2018-03-03 – 2018-03-04 (×5): 10 mg via ORAL
  Filled 2018-03-03 (×5): qty 2

## 2018-03-03 MED ORDER — MIDAZOLAM HCL 5 MG/5ML IJ SOLN
INTRAMUSCULAR | Status: DC | PRN
  Administered 2018-03-03: 2 mg via INTRAVENOUS

## 2018-03-03 MED ORDER — BISACODYL 5 MG PO TBEC: 5.0000 mg | DELAYED_RELEASE_TABLET | Freq: Every day | ORAL | Status: DC | PRN

## 2018-03-03 MED ORDER — LACTATED RINGERS IV SOLN: INTRAVENOUS | Status: DC

## 2018-03-03 MED ORDER — OXYCODONE HCL 5 MG/5ML PO SOLN
5.0000 mg | Freq: Once | ORAL | Status: DC | PRN
  Filled 2018-03-03: qty 5

## 2018-03-03 MED ORDER — TRANEXAMIC ACID-NACL 1000-0.7 MG/100ML-% IV SOLN
1000.0000 mg | Freq: Once | INTRAVENOUS | Status: AC
  Administered 2018-03-03: 1000 mg via INTRAVENOUS
  Filled 2018-03-03: qty 100

## 2018-03-03 MED ORDER — MENTHOL 3 MG MT LOZG: 1.0000 | LOZENGE | OROMUCOSAL | Status: DC | PRN

## 2018-03-03 SURGICAL SUPPLY — 45 items
BLADE EXTENDED COATED 6.5IN (ELECTRODE) ×3 IMPLANT
BLADE SAW SGTL 18X1.27X75 (BLADE) ×2 IMPLANT
BLADE SAW SGTL 18X1.27X75MM (BLADE) ×1
BLADE SURG SZ10 CARB STEEL (BLADE) ×6 IMPLANT
CELLS DAT CNTRL 66122 CELL SVR (MISCELLANEOUS) ×1 IMPLANT
COVER PERINEAL POST (MISCELLANEOUS) ×3 IMPLANT
COVER SURGICAL LIGHT HANDLE (MISCELLANEOUS) ×3 IMPLANT
COVER WAND RF STERILE (DRAPES) IMPLANT
CUP ACETABULAR GRIPTON 100 52 (Orthopedic Implant) ×1 IMPLANT
DRAPE C-ARM 42X120 X-RAY (DRAPES) ×3 IMPLANT
DRAPE STERI IOBAN 125X83 (DRAPES) ×3 IMPLANT
DRAPE U-SHAPE 47X51 STRL (DRAPES) ×6 IMPLANT
DRSG AQUACEL AG ADV 3.5X10 (GAUZE/BANDAGES/DRESSINGS) ×3 IMPLANT
DRSG OPSITE POSTOP 4X6 (GAUZE/BANDAGES/DRESSINGS) ×3 IMPLANT
DURAPREP 26ML APPLICATOR (WOUND CARE) ×3 IMPLANT
ELECT REM PT RETURN 15FT ADLT (MISCELLANEOUS) ×3 IMPLANT
ELIMINATOR HOLE APEX DEPUY (Hips) ×3 IMPLANT
FACESHIELD WRAPAROUND (MASK) ×6 IMPLANT
GLOVE BIO SURGEON STRL SZ8 (GLOVE) ×6 IMPLANT
GLOVE BIOGEL PI IND STRL 8 (GLOVE) ×2 IMPLANT
GLOVE BIOGEL PI INDICATOR 8 (GLOVE) ×4
GOWN STRL REUS W/ TWL XL LVL3 (GOWN DISPOSABLE) ×2 IMPLANT
GOWN STRL REUS W/TWL XL LVL3 (GOWN DISPOSABLE) ×4
GRIPTON 100 52 (Orthopedic Implant) ×3 IMPLANT
HEAD M SROM 36MM 2 (Hips) ×1 IMPLANT
KIT BASIN OR (CUSTOM PROCEDURE TRAY) ×3 IMPLANT
KIT TURNOVER KIT A (KITS) ×3 IMPLANT
LINER NEUTRAL 52X36MM PLUS 4 (Liner) ×3 IMPLANT
MANIFOLD NEPTUNE II (INSTRUMENTS) ×3 IMPLANT
NEEDLE HYPO 22GX1.5 SAFETY (NEEDLE) ×3 IMPLANT
NS IRRIG 1000ML POUR BTL (IV SOLUTION) ×3 IMPLANT
PACK ANTERIOR HIP CUSTOM (KITS) ×3 IMPLANT
PROTECTOR NERVE ULNAR (MISCELLANEOUS) ×3 IMPLANT
RTRCTR WOUND ALEXIS 18CM MED (MISCELLANEOUS) ×3
SROM M HEAD 36MM 2 (Hips) ×3 IMPLANT
STEM FEM ACTIS STD SZ4 (Stem) ×3 IMPLANT
SUT ETHIBOND NAB CT1 #1 30IN (SUTURE) ×6 IMPLANT
SUT VIC AB 1 CT1 27 (SUTURE) ×2
SUT VIC AB 1 CT1 27XBRD ANTBC (SUTURE) ×1 IMPLANT
SUT VIC AB 2-0 CT1 27 (SUTURE) ×2
SUT VIC AB 2-0 CT1 TAPERPNT 27 (SUTURE) ×1 IMPLANT
SUT VIC AB 3-0 PS2 18 (SUTURE) ×2
SUT VIC AB 3-0 PS2 18XBRD (SUTURE) ×1 IMPLANT
SUT VLOC 180 0 24IN GS25 (SUTURE) ×3 IMPLANT
SYR 50ML LL SCALE MARK (SYRINGE) ×3 IMPLANT

## 2018-03-03 NOTE — Transfer of Care (Signed)
Immediate Anesthesia Transfer of Care Note  Patient: [name]  Procedure(s) Performed: Procedure(s): TOTAL HIP ARTHROPLASTY ANTERIOR APPROACH (Left)  Patient Location: PACU  Anesthesia Type:General  Level of Consciousness:  sedated, patient cooperative and responds to stimulation  Airway & Oxygen Therapy:Patient Spontanous Breathing and Patient connected to face mask oxgen  Post-op Assessment:  Report given to PACU RN and Post -op Vital signs reviewed and stable  Post vital signs:  Reviewed and stable  Last Vitals:  Vitals:   03/03/18 0613 03/03/18 0935  BP: 120/79 (!) 61/42  Pulse: 90 79  Resp: 18 14  Temp: 37 C 36.4 C  SpO2: 100% 100%    Complications: No apparent anesthesia complications

## 2018-03-03 NOTE — Interval H&P Note (Signed)
History and Physical Interval Note:  03/03/2018 9:12 AM  [name]  has presented today for surgery, with the diagnosis of LEFT HIP OSTEOARTHRITIS  The various methods of treatment have been discussed with the patient and family. After consideration of risks, benefits and other options for treatment, the patient has consented to  Procedure(s): TOTAL HIP ARTHROPLASTY ANTERIOR APPROACH (Left) as a surgical intervention .  The patient's history has been reviewed, patient examined, no change in status, stable for surgery.  I have reviewed the patient's chart and labs.  Questions were answered to the patient's satisfaction.     Velna OchsPeter G [facility] [place]

## 2018-03-03 NOTE — Anesthesia Procedure Notes (Signed)
Spinal  Patient location during procedure: OR Start time: 03/03/2018 7:22 AM End time: 03/03/2018 7:28 AM Staffing Anesthesiologist: Kaylyn Layer, MD Resident/CRNA: Theodosia Quay, CRNA Performed: resident/CRNA  Preanesthetic Checklist Completed: patient identified, site marked, surgical consent, pre-op evaluation, timeout performed, IV checked, risks and benefits discussed and monitors and equipment checked Spinal Block Patient position: sitting Prep: DuraPrep Patient monitoring: heart rate, cardiac monitor, continuous pulse ox and blood pressure Approach: midline Location: L3-4 Injection technique: single-shot Needle Needle type: Pencan  Needle gauge: 24 G Needle length: 9 cm Needle insertion depth: 7 cm Assessment Sensory level: T6 Additional Notes -heme, -para, VSS.  Pt tolerated well.  Lot and expiration date OK.

## 2018-03-03 NOTE — Evaluation (Signed)
Physical Therapy Evaluation Patient Details Name: [name] MRN: [id number] DOB: September 01, 1980 Today's Date: 03/03/2018   History of Present Illness  38 YO s/P L Direct anterior THA, S/P right THA 10/19.  Clinical Impression  The patient's resting HR 120, up to 150 while mobilizing, only 5' from bed and back. RN aware. Pt admitted with above diagnosis. Pt currently with functional limitations due to the deficits listed below (see PT Problem List).  Pt will benefit from skilled PT to increase their independence and safety with mobility to allow discharge to the venue listed below.      Follow Up Recommendations Follow surgeon's recommendation for DC plan and follow-up therapies    Equipment Recommendations  Wheelchair (measurements PT)(patient requesting a WC)    Recommendations for Other Services       Precautions / Restrictions Precautions Precautions: Fall Precaution Comments: watch HR      Mobility  Bed Mobility Overal bed mobility: Needs Assistance Bed Mobility: Supine to Sit     Supine to sit: HOB elevated;Min assist     General bed mobility comments: used belt and min assist to sit up, assist left leg onto bed  Transfers Overall transfer level: Needs assistance Equipment used: Rolling walker (2 wheeled) Transfers: Sit to/from Stand Sit to Stand: Min assist         General transfer comment: cues for hand am=nd left leg position.  Ambulation/Gait Ambulation/Gait assistance: Min assist Gait Distance (Feet): 6 Feet Assistive device: Rolling walker (2 wheeled) Gait Pattern/deviations: Step-through pattern;Step-to pattern     General Gait Details: HR  to 150. Had patient back up to bed.  Stairs            Wheelchair Mobility    Modified Rankin (Stroke Patients Only)       Balance                                             Pertinent Vitals/Pain Pain Assessment: Faces Pain Score: 7  Pain Location: left thigh Pain  Descriptors / Indicators: Aching;Discomfort Pain Intervention(s): Monitored during session;Premedicated before session;Ice applied;Repositioned    Home Living Family/patient expects to be discharged to:: Private residence Living Arrangements: Spouse/significant other;Children Available Help at Discharge: Family;Available 24 hours/day Type of Home: Other(Comment) Home Access: Stairs to enter Entrance Stairs-Rails: None Entrance Stairs-Number of Steps: 2 Home Layout: Two level Home Equipment: Cane - single point;Walker - 2 wheels      Prior Function Level of Independence: Independent with assistive device(s)         Comments: using cane for all ambulation      Hand Dominance        Extremity/Trunk Assessment   Upper Extremity Assessment Upper Extremity Assessment: Overall WFL for tasks assessed    Lower Extremity Assessment Lower Extremity Assessment: LLE deficits/detail LLE Deficits / Details: assist for moving the left leg    Cervical / Trunk Assessment Cervical / Trunk Assessment: Normal  Communication   Communication: No difficulties  Cognition Arousal/Alertness: Awake/alert Behavior During Therapy: WFL for tasks assessed/performed Overall Cognitive Status: Within Functional Limits for tasks assessed                                        General Comments      Exercises  Assessment/Plan    PT Assessment Patient needs continued PT services  PT Problem List Decreased strength;Decreased range of motion;Decreased activity tolerance;Decreased mobility;Decreased knowledge of precautions;Decreased safety awareness;Decreased knowledge of use of DME       PT Treatment Interventions DME instruction;Therapeutic exercise;Gait training;Stair training;Functional mobility training;Therapeutic activities;Patient/family education    PT Goals (Current goals can be found in the Care Plan section)  Acute Rehab PT Goals Patient Stated Goal: to go home  tomorrow PT Goal Formulation: With patient Time For Goal Achievement: 03/07/18 Potential to Achieve Goals: Good    Frequency 7X/week   Barriers to discharge        Co-evaluation               AM-PAC PT "6 Clicks" Mobility  Outcome Measure Help needed turning from your back to your side while in a flat bed without using bedrails?: A Lot Help needed moving from lying on your back to sitting on the side of a flat bed without using bedrails?: A Lot Help needed moving to and from a bed to a chair (including a wheelchair)?: A Lot Help needed standing up from a chair using your arms (e.g., wheelchair or bedside chair)?: A Lot Help needed to walk in hospital room?: A Lot   6 Click Score: 10    End of Session Equipment Utilized During Treatment: Gait belt Activity Tolerance: Treatment limited secondary to medical complications (Comment) Patient left: in bed;with call bell/phone within reach Nurse Communication: Mobility status PT Visit Diagnosis: Unsteadiness on feet (R26.81);Pain Pain - Right/Left: Left Pain - part of body: Hip    Time: [id number] PT Time Calculation (min) (ACUTE ONLY): 26 min   Charges:   PT Evaluation $PT Eval Low Complexity: 1 Low PT Treatments $Gait Training: 8-22 mins        Blanchard KelchKaren Jetaun Colbath PT Acute Rehabilitation Services Pager [phone] Office [phone]   Rada HayHill, Violeta Lecount Elizabeth 03/03/2018, 5:34 PM

## 2018-03-03 NOTE — Progress Notes (Signed)
Made Dr.Dalldorf aware of patient tachycardia when she was experiencing high levels of pain, administering oxycodone and dilaudid and then heart rate in the 150's during PT. Patients resting heart rate in 120's now. New order for saline bolus and to reevaluate.

## 2018-03-03 NOTE — Anesthesia Postprocedure Evaluation (Signed)
Anesthesia Post Note  Patient: [name]  Procedure(s) Performed: TOTAL HIP ARTHROPLASTY ANTERIOR APPROACH (Left Hip)     Patient location during evaluation: PACU Anesthesia Type: Spinal Level of consciousness: awake and alert Pain management: pain level controlled Vital Signs Assessment: post-procedure vital signs reviewed and stable Respiratory status: spontaneous breathing, nonlabored ventilation and respiratory function stable Cardiovascular status: blood pressure returned to baseline and stable Postop Assessment: no apparent nausea or vomiting and spinal receding Anesthetic complications: no    Last Vitals:  Vitals:   03/03/18 1100 03/03/18 1142  BP: 115/78 131/80  Pulse: 65 86  Resp: 13 16  Temp: 36.4 C 36.8 C  SpO2: 100% 100%    Last Pain:  Vitals:   03/03/18 1142  TempSrc: Oral  PainSc:                  Kaylyn Layer

## 2018-03-03 NOTE — Op Note (Signed)
PRE-OP DIAGNOSIS:  LEFT HIP DEGENERATIVE JOINT DISEASE POST-OP DIAGNOSIS: same PROCEDURE:  LEFT TOTAL HIP ARTHROPLASTY ANTERIOR APPROACH ANESTHESIA:  Spinal and MAC SURGEON:  Melrose Nakayama MD ASSISTANT:  Loni Dolly PA-C   INDICATIONS FOR PROCEDURE:  The patient is a 38 y.o. female with a long history of a painful hip.  This has persisted despite multiple conservative measures.  The patient has persisted with pain and dysfunction making rest and activity difficult.  A total hip replacement is offered as surgical treatment.  Informed operative consent was obtained after discussion of possible complications including reaction to anesthesia, infection, neurovascular injury, dislocation, DVT, PE, and death.  The importance of the postoperative rehab program to optimize result was stressed with the patient.  SUMMARY OF FINDINGS AND PROCEDURE:  Under the above anesthesia through a anterior approach an the Hana table a left THR was performed.  The patient had severe degenerative change and good bone quality.  We used DePuy components to replace the hip and these were size 4 standard Actis femur capped with a -2 21m metal hip ball.  On the acetabular side we used a size 52 Gription shell with a plus 4 neutral polyethylene liner.  We did use a hole eliminator.  ALoni DollyPA-C assisted throughout and was invaluable to the completion of the case in that he helped position and retract while I performed the procedure.  He also closed simultaneously to help minimize OR time.  I used fluoroscopy throughout the case to check position of components and leg lengths and read all these views myself.  DESCRIPTION OF PROCEDURE:  The patient was taken to the OR suite where the above anesthetic was applied.  The patient was then positioned on the Hana table supine.  All bony prominences were appropriately padded.  Prep and drape was then performed in normal sterile fashion.  The patient was given kefzol preoperative  antibiotic and an appropriate time out was performed.  We then took an anterior approach to the left hip.  Dissection was taken through adipose to the tensor fascia lata fascia.  This structure was incised longitudinally and we dissected in the intermuscular interval just medial to this muscle.  Cobra retractors were placed superior and inferior to the femoral neck superficial to the capsule.  A capsular incision was then made and the retractors were placed along the femoral neck.  Xray was brought in to get a good level for the femoral neck cut which was made with an oscillating saw and osteotome.  The femoral head was removed with a corkscrew.  The acetabulum was exposed and some labral tissues were excised. Reaming was taken to the inside wall of the pelvis and sequentially up to 1 mm smaller than the actual component.  A trial of components was done and then the aforementioned acetabular shell was placed in appropriate tilt and anteversion confirmed by fluoroscopy. The liner was placed along with the hole eliminator and attention was turned to the femur.  The leg was brought down and over into adduction and the elevator bar was used to raise the femur up gently in the wound.  The piriformis was released with care taken to preserve the obturator internus attachment and all of the posterior capsule. The femur was reamed and then broached to the appropriate size.  A trial reduction was done and the aforementioned head and neck assembly gave uKoreathe best stability in extension with external rotation.  Leg lengths were felt to be about equal by  fluoroscopic exam.  The trial components were removed and the wound irrigated.  We then placed the femoral component in appropriate anteversion.  The head was applied to a dry stem neck and the hip again reduced.  It was again stable in the aforementioned position.  The would was irrigated again followed by re-approximation of anterior capsule with ethibond suture. Tensor  fascia was repaired with V-loc suture  followed by deep closure with #O and #2 undyed vicryl.  Skin was closed with subQ stitch and steristrips followed by a sterile dressing.  EBL and IOF can be obtained from anesthesia records.  DISPOSITION:  The patient was extubated in the OR and taken to PACU in stable condition to be admitted to the Orthopedic Surgery for appropriate post-op care to include perioperative antibiotics and DVT prophylaxis.

## 2018-03-04 DIAGNOSIS — M1612 Unilateral primary osteoarthritis, left hip: Secondary | ICD-10-CM | POA: Diagnosis not present

## 2018-03-04 MED ORDER — CELECOXIB 200 MG PO CAPS
200.0000 mg | ORAL_CAPSULE | Freq: Two times a day (BID) | ORAL | Status: DC
  Administered 2018-03-04: 200 mg via ORAL
  Filled 2018-03-04: qty 1

## 2018-03-04 MED ORDER — GABAPENTIN 300 MG PO CAPS
300.0000 mg | ORAL_CAPSULE | Freq: Three times a day (TID) | ORAL | Status: DC
  Administered 2018-03-04: 300 mg via ORAL
  Filled 2018-03-04: qty 1

## 2018-03-04 MED ORDER — ASPIRIN 325 MG PO TBEC: 325.0000 mg | DELAYED_RELEASE_TABLET | Freq: Two times a day (BID) | ORAL | 0 refills | Status: DC

## 2018-03-04 MED ORDER — CELECOXIB 200 MG PO CAPS: 200.0000 mg | ORAL_CAPSULE | Freq: Two times a day (BID) | ORAL | 1 refills | Status: DC

## 2018-03-04 MED ORDER — GABAPENTIN 300 MG PO CAPS: 300.0000 mg | ORAL_CAPSULE | Freq: Three times a day (TID) | ORAL | 0 refills | Status: DC

## 2018-03-04 MED ORDER — OXYCODONE-ACETAMINOPHEN 5-325 MG PO TABS: 1.0000 | ORAL_TABLET | ORAL | 0 refills | Status: DC | PRN

## 2018-03-04 MED ORDER — TIZANIDINE HCL 2 MG PO TABS: 2.0000 mg | ORAL_TABLET | Freq: Four times a day (QID) | ORAL | 0 refills | Status: DC | PRN

## 2018-03-04 NOTE — Discharge Instructions (Signed)

## 2018-03-04 NOTE — Care Management Note (Signed)
Case Management Note  Patient Details  Name: [name] MRN: [id number] Date of Birth: Jul 07, 1980  Subjective/Objective:   Discharge planning, spoke with patient at beside. [facility] to eval and treat.               Action/Plan: Contacted [facility] for referral. They have accepted. Has RW and 3-n-1. [phone]   Expected Discharge Date:  03/04/18               Expected Discharge Plan:  Home w Home Health Services  In-House Referral:  NA  Discharge planning Services  CM Consult  Post Acute Care Choice:  Home Health Choice offered to:  Patient  DME Arranged:  N/A DME Agency:  NA  HH Arranged:  PT HH Agency:  [facility]  Status of Service:  Completed, signed off  If discussed at Long Length of Stay Meetings, dates discussed:    Additional Comments:  Alexis Goodelleele, Floyd Lusignan K, RN 03/04/2018, 12:57 PM

## 2018-03-04 NOTE — Progress Notes (Signed)
PATIENT ID: [name]  MRN: [id number]  DOB/AGE:  1980/06/08 / 38 y.o.  1 Day Post-Op Procedure(s) (LRB): TOTAL HIP ARTHROPLASTY ANTERIOR APPROACH (Left)    PROGRESS NOTE Subjective: Patient is alert, oriented, no Nausea, no Vomiting, yes passing gas, . Taking PO well. Denies SOB, Chest or Calf Pain. Using Incentive Spirometer, PAS in place. Ambulate WBAT with pt walking 5 ft with therapy yesterday.  Limited by increased HR. Patient reports pain as  moderate.    Objective: Vital signs in last 24 hours: Vitals:   03/03/18 1515 03/03/18 2201 03/04/18 0222 03/04/18 0524  BP: (!) 127/91 120/73 135/85 (!) 133/91  Pulse: (!) 112 (!) 116 (!) 114 (!) 103  Resp: 17     Temp: 98.6 F (37 C) 98.5 F (36.9 C) 98.7 F (37.1 C) 97.9 F (36.6 C)  TempSrc: Oral Oral Oral Oral  SpO2: 100% 100% 99% 100%  Weight:      Height:          Intake/Output from previous day: I/O last 3 completed shifts: In: 5035 [P.O.:1140; I.V.:3695; IV Piggyback:200] Out: 2775 [Urine:2525; Blood:250]   Intake/Output this shift: Total I/O In: -  Out: 250 [Urine:250]   LABORATORY DATA: No results for input(s): WBC, HGB, HCT, PLT, NA, K, CL, CO2, BUN, CREATININE, GLUCOSE, GLUCAP, INR, CALCIUM in the last 72 hours.  Invalid input(s): PT, 2  Examination: Neurologically intact Neurovascular intact Sensation intact distally Intact pulses distally Dorsiflexion/Plantar flexion intact Incision: dressing C/D/I and no drainage No cellulitis present Compartment soft} XR AP&Lat of hip shows well placed\fixed THA  Assessment:   1 Day Post-Op Procedure(s) (LRB): TOTAL HIP ARTHROPLASTY ANTERIOR APPROACH (Left) ADDITIONAL DIAGNOSIS:  Expected Acute Blood Loss Anemia, Acute Blood Loss Anemia Anticipated LOS equal to or greater than 2 midnights due to - Age 43 and older with one or more of the following:  - Obesity  - Expected need for hospital services (PT, OT, Nursing) required for safe  discharge  -  Anticipated need for postoperative skilled nursing care or inpatient rehab  - Active co-morbidities: Cardiac Arrhythmia    Plan: PT/OT WBAT, THA  DVT Prophylaxis: SCDx72 hrs, ASA 81 mg BID x 2 weeks  DISCHARGE PLAN: Home, when pt passes therapy goals  DISCHARGE NEEDS: HHPT, Walker and 3-in-1 comode seat

## 2018-03-04 NOTE — Progress Notes (Signed)
CSW consult-SNF  Plan: Home w/ Home Health CSW signing off.   Vivi Barrack, Alexander Mt, MSW Clinical Social Worker  [phone] 03/04/2018  8:47 AM

## 2018-03-04 NOTE — Progress Notes (Signed)
Physical Therapy Treatment Patient Details Name: [name] MRN: [id number] DOB: [birth year] Today's Date: 03/04/2018    History of Present Illness 38 YO s/P L Direct anterior THA, S/P right THA 10/19.    PT Comments    The patient is ready for DC. Reviewed HEP and safety on stairs.   Follow Up Recommendations  Follow surgeon's recommendation for DC plan and follow-up therapies     Equipment Recommendations  Wheelchair (measurements PT)    Recommendations for Other Services       Precautions / Restrictions Precautions Precautions: Fall Precaution Comments: watch HR    Mobility  Bed Mobility   Bed Mobility: Supine to Sit     Supine to sit: HOB elevated;Min assist     General bed mobility comments: to sitting mod I, used belt to self assist leg onto bed.  Transfers Overall transfer level: Needs assistance Equipment used: Rolling walker (2 wheeled) Transfers: Sit to/from Stand Sit to Stand: Supervision         General transfer comment: cues for hand and left leg position.  Ambulation/Gait Ambulation/Gait assistance: Supervision Gait Distance (Feet): 20 Feet(x 2) Assistive device: Rolling walker (2 wheeled) Gait Pattern/deviations: Step-through pattern;Step-to pattern     General Gait Details: cues for sequence   Stairs Stairs: Yes Stairs assistance: Min assist;Mod assist Stair Management: No rails;One rail Left;Step to pattern;Forwards;With cane   General stair comments: used cane and rail x 5 streps, used HHA and cane x 3 steps. cues for safety   Wheelchair Mobility    Modified Rankin (Stroke Patients Only)       Balance                                            Cognition Arousal/Alertness: Awake/alert                                            Exercises   General Comments        Pertinent Vitals/Pain Pain Score: 4  Faces Pain Scale: Hurts even more Pain Location: left thigh Pain  Descriptors / Indicators: Aching;Discomfort Pain Intervention(s): Monitored during session;Premedicated before session;Repositioned;Ice applied    Home Living                      Prior Function            PT Goals (current goals can now be found in the care plan section) Progress towards PT goals: Progressing toward goals    Frequency    7X/week      PT Plan Current plan remains appropriate    Co-evaluation              AM-PAC PT "6 Clicks" Mobility   Outcome Measure  Help needed turning from your back to your side while in a flat bed without using bedrails?: A Little Help needed moving from lying on your back to sitting on the side of a flat bed without using bedrails?: A Little Help needed moving to and from a bed to a chair (including a wheelchair)?: A Little Help needed standing up from a chair using your arms (e.g., wheelchair or bedside chair)?: A Little Help needed to walk in hospital room?: A Little Help needed climbing 3-5 steps  with a railing? : A Little 6 Click Score: 18    End of Session Equipment Utilized During Treatment: Gait belt Activity Tolerance: Patient tolerated treatment well Patient left: in bed Nurse Communication: Mobility status PT Visit Diagnosis: Unsteadiness on feet (R26.81);Pain Pain - Right/Left: Left Pain - part of body: Hip     Time: 1210-1234 PT Time Calculation (min) (ACUTE ONLY): 24 min  Charges:  $Gait Training: 23-37 mins $Therapeutic Exercise: 8-22 mins                     Blanchard Kelch PT Acute Rehabilitation Services Pager [phone] Office [phone]    Rada Hay 03/04/2018, 1:16 PM

## 2018-03-04 NOTE — Progress Notes (Signed)
Physical Therapy Treatment Patient Details Name: [name] MRN: [id number] DOB: [birth year] Today's Date: 03/04/2018    History of Present Illness 38 YO s/P L Direct anterior THA, S/P right THA 10/19.    PT Comments    The patient is progressing well. HR remains elevated, 120's for mobility. Plans Dc today.   Follow Up Recommendations  Follow surgeon's recommendation for DC plan and follow-up therapies(HEP)     Equipment Recommendations  Wheelchair (measurements PT)    Recommendations for Other Services       Precautions / Restrictions Precautions Precautions: Fall Precaution Comments: watch HR    Mobility  Bed Mobility               General bed mobility comments: to sitting mod I, used belt to self assist leg onto bed.  Transfers Overall transfer level: Needs assistance Equipment used: Rolling walker (2 wheeled) Transfers: Sit to/from Stand Sit to Stand: Supervision            Ambulation/Gait Ambulation/Gait assistance: Supervision Gait Distance (Feet): 80 Feet Assistive device: Rolling walker (2 wheeled) Gait Pattern/deviations: Step-through pattern;Step-to pattern     General Gait Details: cues for sequence   Stairs Stairs: Yes Stairs assistance: Min assist;Mod assist Stair Management: No rails;One rail Left;Step to pattern;Forwards;With cane   General stair comments: used cane and rail x 5 streps, used HHA and cane x 3 steps. cues for safety   Wheelchair Mobility    Modified Rankin (Stroke Patients Only)       Balance                                            Cognition Arousal/Alertness: Awake/alert                                            Exercises Total Joint Exercises Quad Sets: AROM;Both;10 reps Short Arc Quad: AROM;Left;10 reps;Supine Heel Slides: AAROM;Left;10 reps;Supine Hip ABduction/ADduction: AAROM;Left;10 reps;Supine Long Arc Quad: AROM;Left;10 reps;Seated     General Comments        Pertinent Vitals/Pain Pain Score: 4  Pain Location: left thigh Pain Descriptors / Indicators: Aching;Discomfort Pain Intervention(s): Monitored during session;Premedicated before session    Home Living                      Prior Function            PT Goals (current goals can now be found in the care plan section) Progress towards PT goals: Progressing toward goals    Frequency    7X/week      PT Plan Current plan remains appropriate    Co-evaluation              AM-PAC PT "6 Clicks" Mobility   Outcome Measure  Help needed turning from your back to your side while in a flat bed without using bedrails?: A Little Help needed moving from lying on your back to sitting on the side of a flat bed without using bedrails?: A Little Help needed moving to and from a bed to a chair (including a wheelchair)?: A Little Help needed standing up from a chair using your arms (e.g., wheelchair or bedside chair)?: A Little Help needed to walk in hospital room?: A Little  Help needed climbing 3-5 steps with a railing? : A Little 6 Click Score: 18    End of Session Equipment Utilized During Treatment: Gait belt Activity Tolerance: Patient tolerated treatment well Patient left: in bed Nurse Communication: Mobility status PT Visit Diagnosis: Unsteadiness on feet (R26.81);Pain Pain - Right/Left: Left     Time: 1137-1200 PT Time Calculation (min) (ACUTE ONLY): 23 min  Charges:  $Gait Training: 8-22 mins $Therapeutic Exercise: 8-22 mins                     Blanchard Kelch PT Acute Rehabilitation Services Pager [phone] Office [phone]    Rada Hay 03/04/2018, 1:11 PM

## 2018-03-05 ENCOUNTER — Encounter (HOSPITAL_COMMUNITY): Payer: Self-pay | Admitting: Orthopaedic Surgery

## 2018-03-06 NOTE — Discharge Summary (Signed)
Patient ID: [name] MRN: [id number] DOB/AGE: 1981/02/03 38 y.o.  Admit date: 03/03/2018 Discharge date: 03/04/2018  Admission Diagnoses:  Principal Problem:   Osteoarthritis of left hip Active Problems:   Primary osteoarthritis of left hip   Discharge Diagnoses:  Same  Past Medical History:  Diagnosis Date  . Anemia yrs ago  . Anxiety yrs ago  . Arthritis    oa  . Depression yrs ago    Surgeries: Procedure(s): TOTAL HIP ARTHROPLASTY ANTERIOR APPROACH on 03/03/2018   Consultants:   Discharged Condition: Improved  Hospital Course: [name] is an 38 y.o. female who was admitted 03/03/2018 for operative treatment ofOsteoarthritis of left hip. Patient has severe unremitting pain that affects sleep, daily activities, and work/hobbies. After pre-op clearance the patient was taken to the operating room on 03/03/2018 and underwent  Procedure(s): TOTAL HIP ARTHROPLASTY ANTERIOR APPROACH.    Patient was given perioperative antibiotics:  Anti-infectives (From admission, onward)   Start     Dose/Rate Route Frequency Ordered Stop   03/03/18 1400  ceFAZolin (ANCEF) IVPB 2g/100 mL premix     2 g 200 mL/hr over 30 Minutes Intravenous Every 6 hours 03/03/18 1153 03/03/18 2017   03/03/18 0600  ceFAZolin (ANCEF) IVPB 2g/100 mL premix     2 g 200 mL/hr over 30 Minutes Intravenous On call to O.R. 03/03/18 0541 03/03/18 0732       Patient was given sequential compression devices, early ambulation, and chemoprophylaxis to prevent DVT.  Patient benefited maximally from hospital stay and there were no complications.    Recent vital signs: No data found.   Recent laboratory studies: No results for input(s): WBC, HGB, HCT, PLT, NA, K, CL, CO2, BUN, CREATININE, GLUCOSE, INR, CALCIUM in the last 72 hours.  Invalid input(s): PT, 2   Discharge Medications:   Allergies as of 03/04/2018   No Known Allergies     Medication List    STOP taking these medications    acetaminophen 500 MG tablet Commonly known as:  TYLENOL   ibuprofen 200 MG tablet Commonly known as:  ADVIL,MOTRIN   traMADol 50 MG tablet Commonly known as:  ULTRAM     TAKE these medications   aspirin 325 MG EC tablet Take 1 tablet (325 mg total) by mouth 2 (two) times daily after a meal. What changed:    medication strength  how much to take  when to take this   calcium carbonate 1500 (600 Ca) MG Tabs tablet Commonly known as:  OSCAL Take 600 mg by mouth every 3 (three) days.   celecoxib 200 MG capsule Commonly known as:  CELEBREX Take 1 capsule (200 mg total) by mouth 2 (two) times daily.   gabapentin 300 MG capsule Commonly known as:  NEURONTIN Take 1 capsule (300 mg total) by mouth 3 (three) times daily.   oxyCODONE-acetaminophen 5-325 MG tablet Commonly known as:  PERCOCET/ROXICET Take 1 tablet by mouth every 4 (four) hours as needed for severe pain.   tiZANidine 2 MG tablet Commonly known as:  ZANAFLEX Take 1 tablet (2 mg total) by mouth every 6 (six) hours as needed.   vitamin B-12 500 MCG tablet Commonly known as:  CYANOCOBALAMIN Take 500 mcg by mouth every 3 (three) days.   vitamin E 400 UNIT capsule Take 400 Units by mouth every 3 (three) days.            Discharge Care Instructions  (From admission, onward)         Start  Ordered   03/04/18 0000  Weight bearing as tolerated     03/04/18 0815          Diagnostic Studies: Dg C-arm 1-60 Min-no Report  Result Date: 03/03/2018 Fluoroscopy was utilized by the requesting physician.  No radiographic interpretation.   Dg Hip Operative Unilat With Pelvis Left  Result Date: 03/03/2018 CLINICAL DATA:  Left anterior hip replacement. EXAM: OPERATIVE left HIP (WITH PELVIS IF PERFORMED)  VIEWS TECHNIQUE: Fluoroscopic spot image(s) were submitted for interpretation post-operatively. COMPARISON:  None. FINDINGS: Two images show total hip replacement on the left, bipolar type apparently.  Components appear well positioned. No radiographically detectable complication. IMPRESSION: Good appearance following total hip replacement on the left. Electronically Signed   By: Paulina Fusi M.D.   On: 03/03/2018 09:27    Disposition:   Discharge Instructions    Call MD / Call 911   Complete by:  As directed    If you experience chest pain or shortness of breath, CALL 911 and be transported to the hospital emergency room.  If you develope a fever above 101 F, pus (white drainage) or increased drainage or redness at the wound, or calf pain, call your surgeon's office.   Constipation Prevention   Complete by:  As directed    Drink plenty of fluids.  Prune juice may be helpful.  You may use a stool softener, such as Colace (over the counter) 100 mg twice a day.  Use MiraLax (over the counter) for constipation as needed.   Diet - low sodium heart healthy   Complete by:  As directed    Driving restrictions   Complete by:  As directed    No driving for 2 weeks   Increase activity slowly as tolerated   Complete by:  As directed    Patient may shower   Complete by:  As directed    You may shower without a dressing once there is no drainage.  Do not wash over the wound.  If drainage remains, cover wound with plastic wrap and then shower.   Weight bearing as tolerated   Complete by:  As directed       Follow-up Information    Marcene Corning, MD. Schedule an appointment as soon as possible for a visit in 2 weeks.   Specialty:  Orthopedic Surgery Contact information: [address] [place] [address] [phone]        [facility] Follow up.   Specialty:  Home Health Services Why:  physical therapy Contact information: [address] [place] [address] [phone]            Signed: Ginger Organ Weston Kallman 03/06/2018, 4:46 PM

## 2020-01-26 IMAGING — DX DG CHEST 2V
2 series · 2 of 2 positions shown · non-contrast
Comparison: None.

CLINICAL DATA: Preoperative examination prior to total hip joint
replacement. Current smoker. No chest complaints.

EXAM:
CHEST - 2 VIEW

[chest pa]
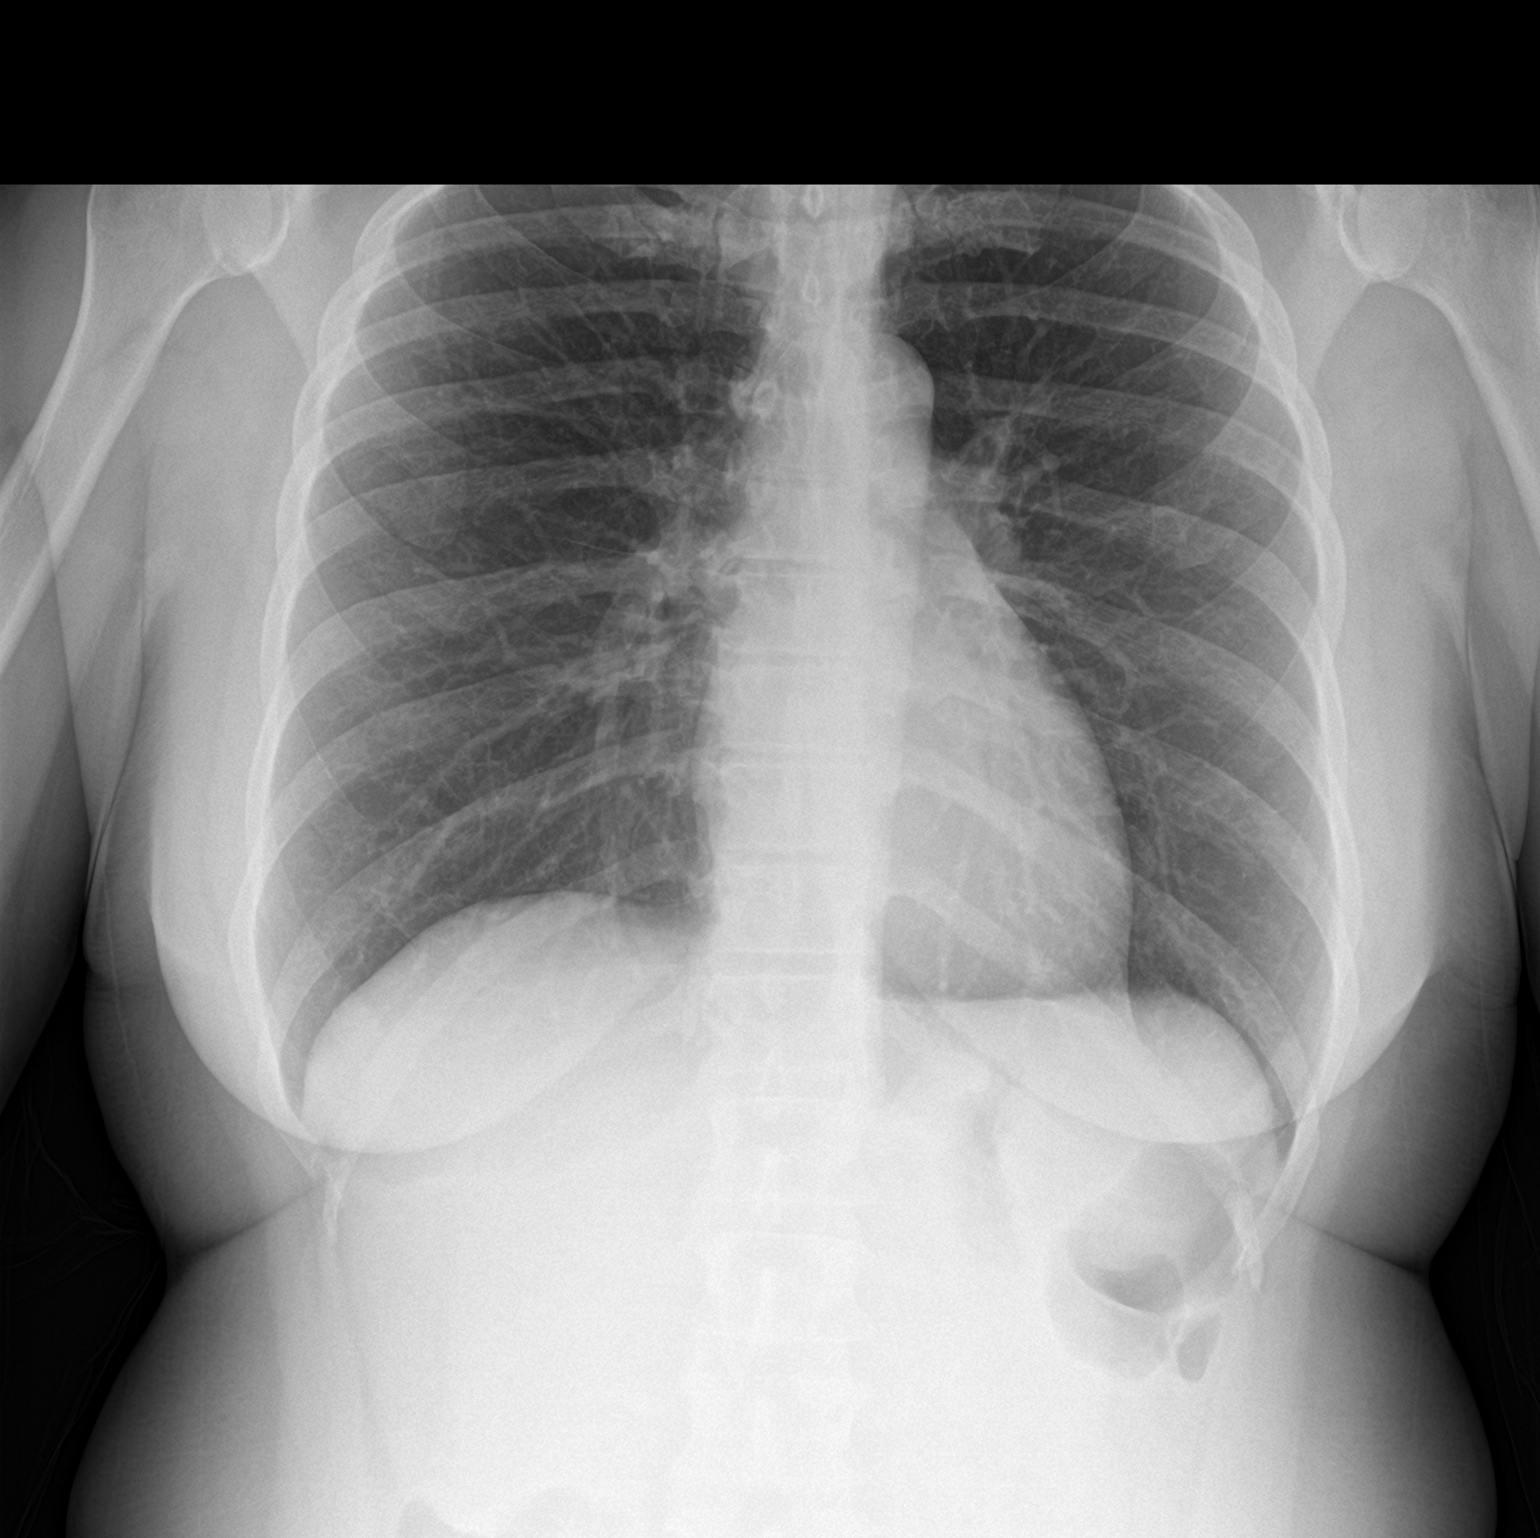

[chest lat]
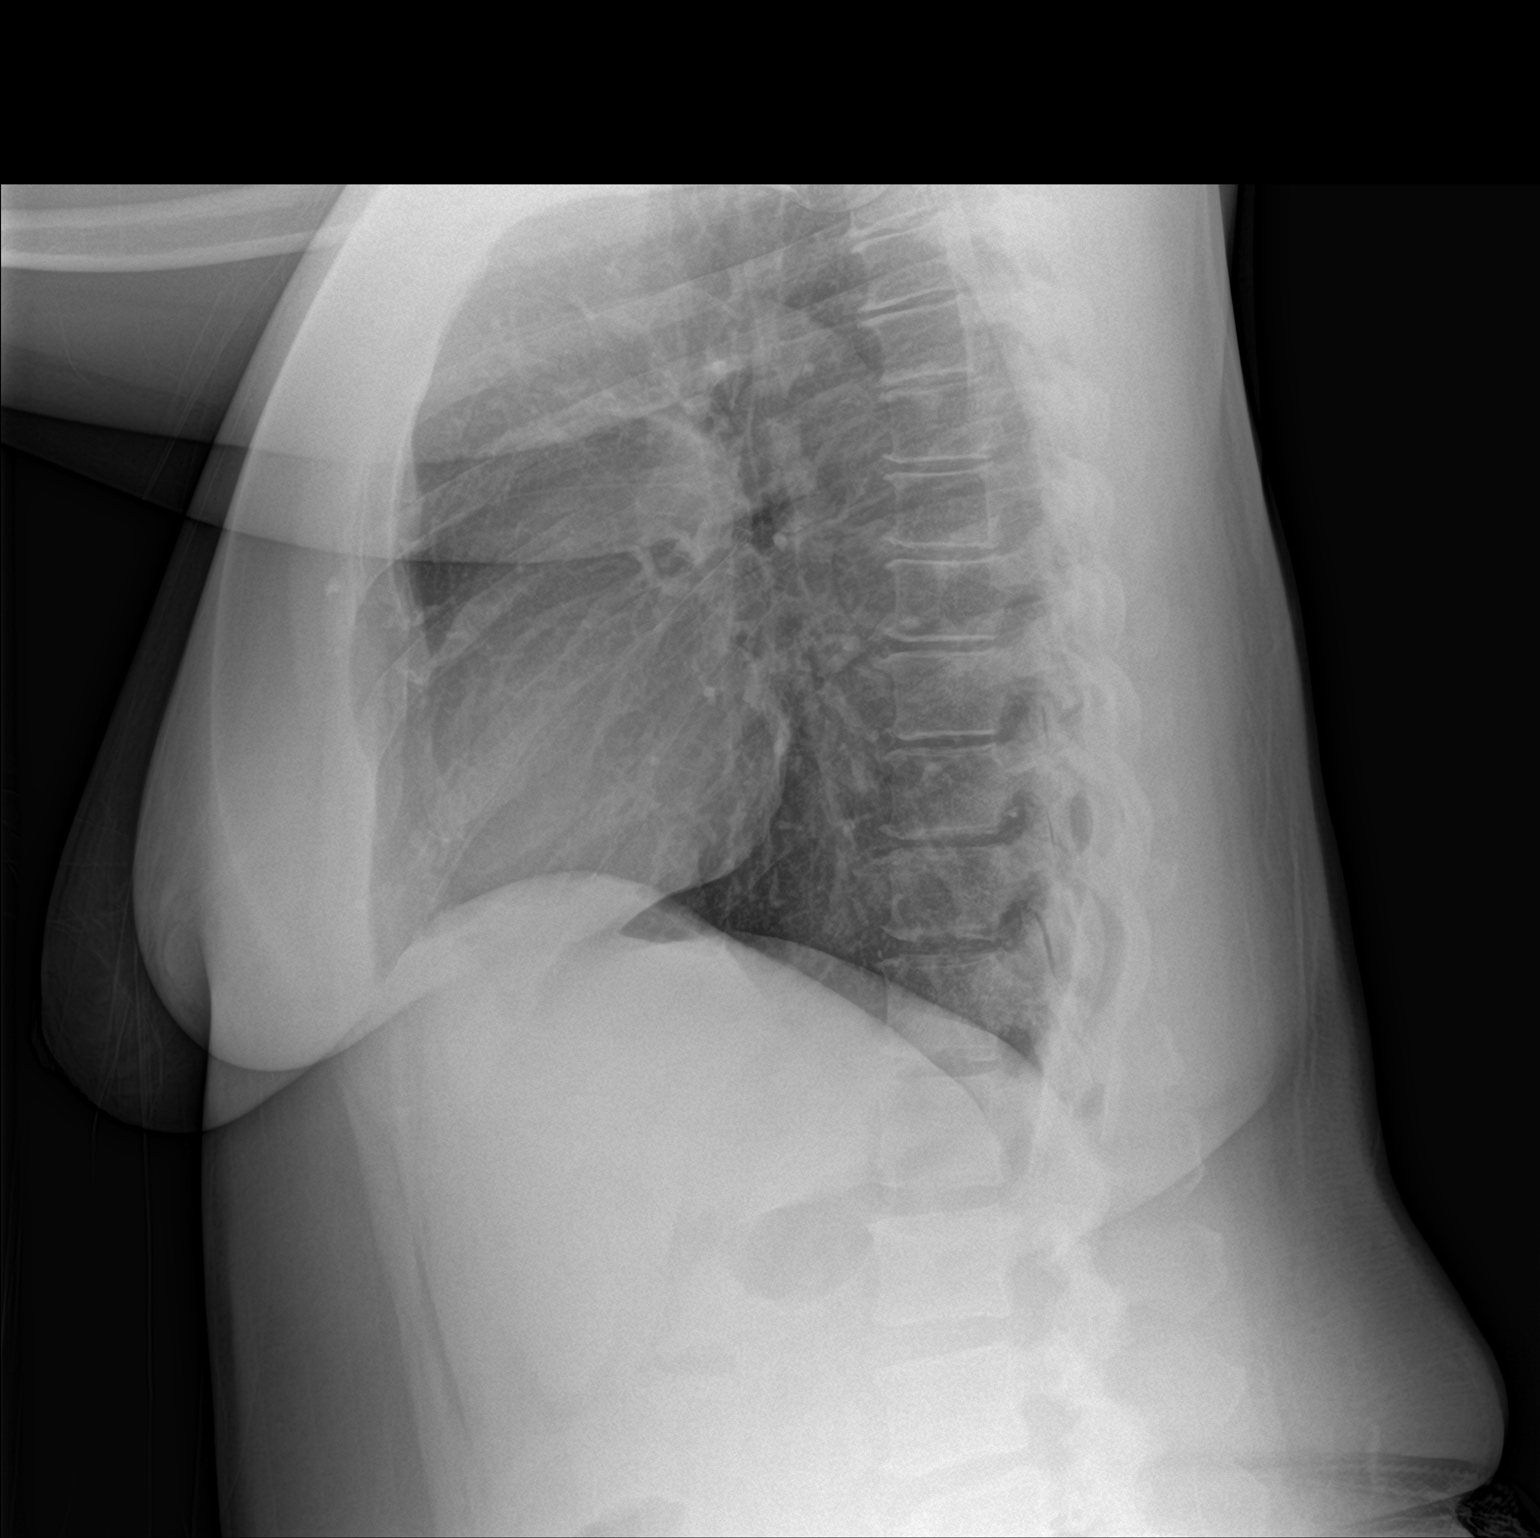

[2 of 2 positions shown; findings below may reference images not displayed]

FINDINGS: The lungs are well-expanded and clear. The heart and pulmonary
vascularity are normal. There is no pleural effusion. The trachea is
midline. The bony thorax exhibits no acute abnormality.
IMPRESSION: There is no active cardiopulmonary disease.

## 2020-02-02 IMAGING — RF DG HIP (WITH PELVIS) OPERATIVE*R*
1 series · 2 of 2 positions shown · non-contrast
Comparison: None

FLUOROSCOPY TIME:  0 minutes 11 seconds

Dose: 1.11 mGy

Images: 2

CLINICAL DATA: Anterior RIGHT hip replacement

EXAM:
OPERATIVE RIGHT HIP (WITH PELVIS IF PERFORMED) 2 VIEWS
TECHNIQUE: Fluoroscopic spot image(s) were submitted for interpretation
post-operatively.

[Series 1: unknown protocol · 0.20mm/px · 2 of 2 slices shown]
[im 1/2]
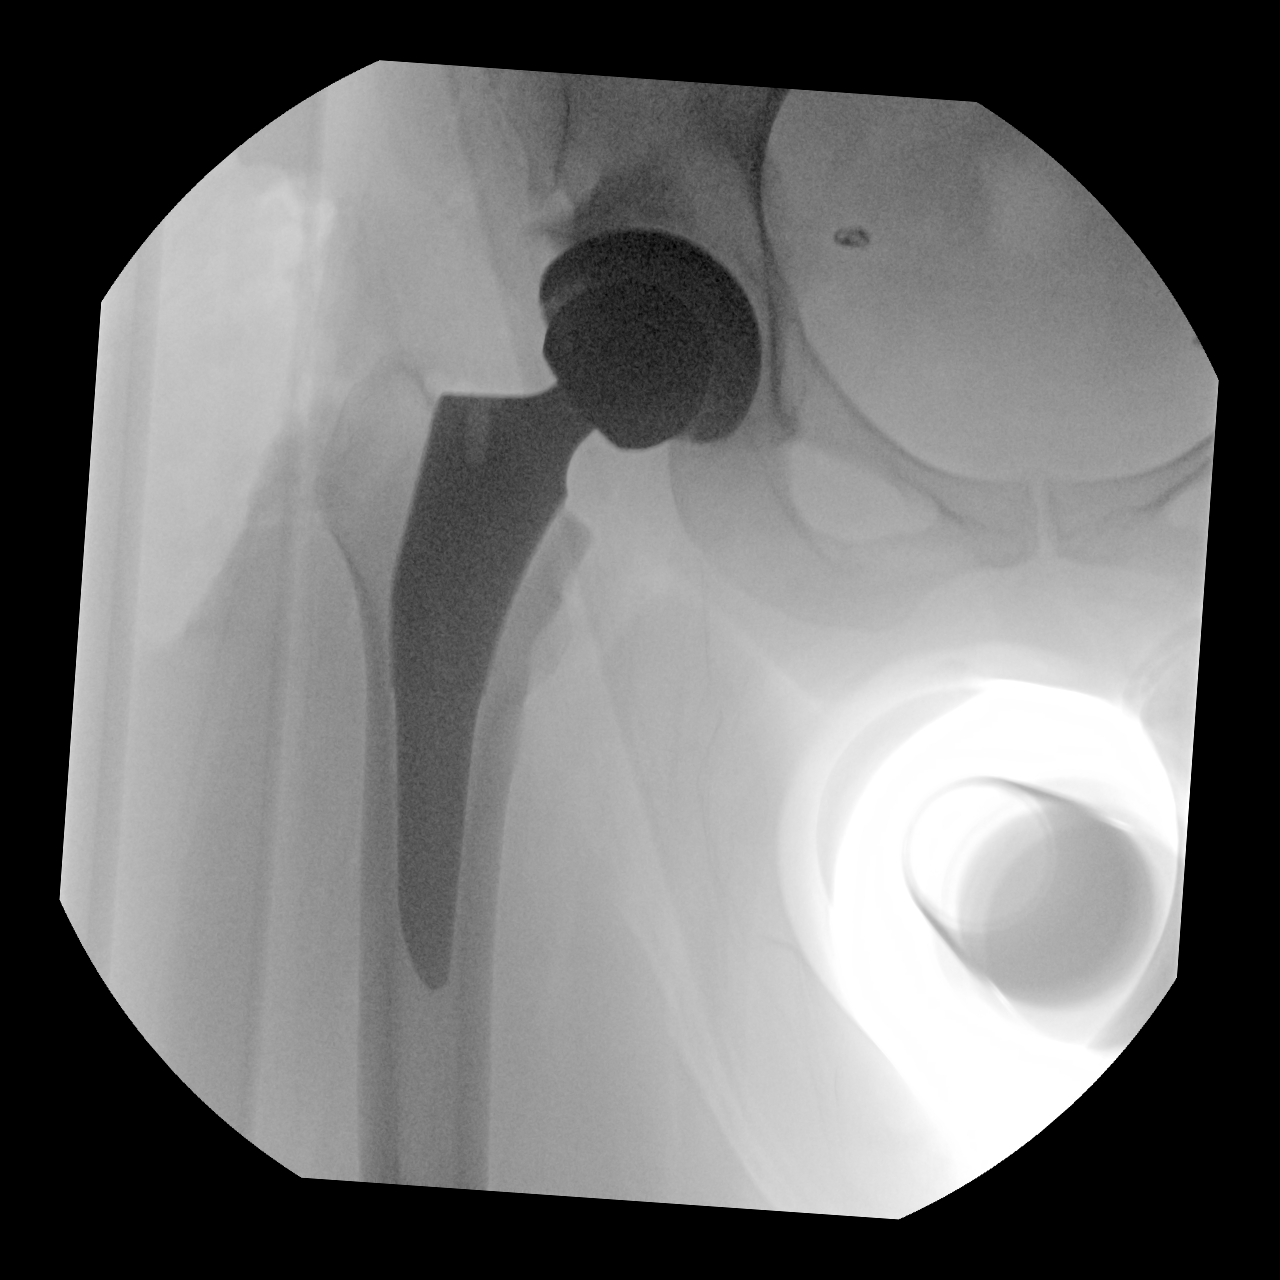
[im 2/2]
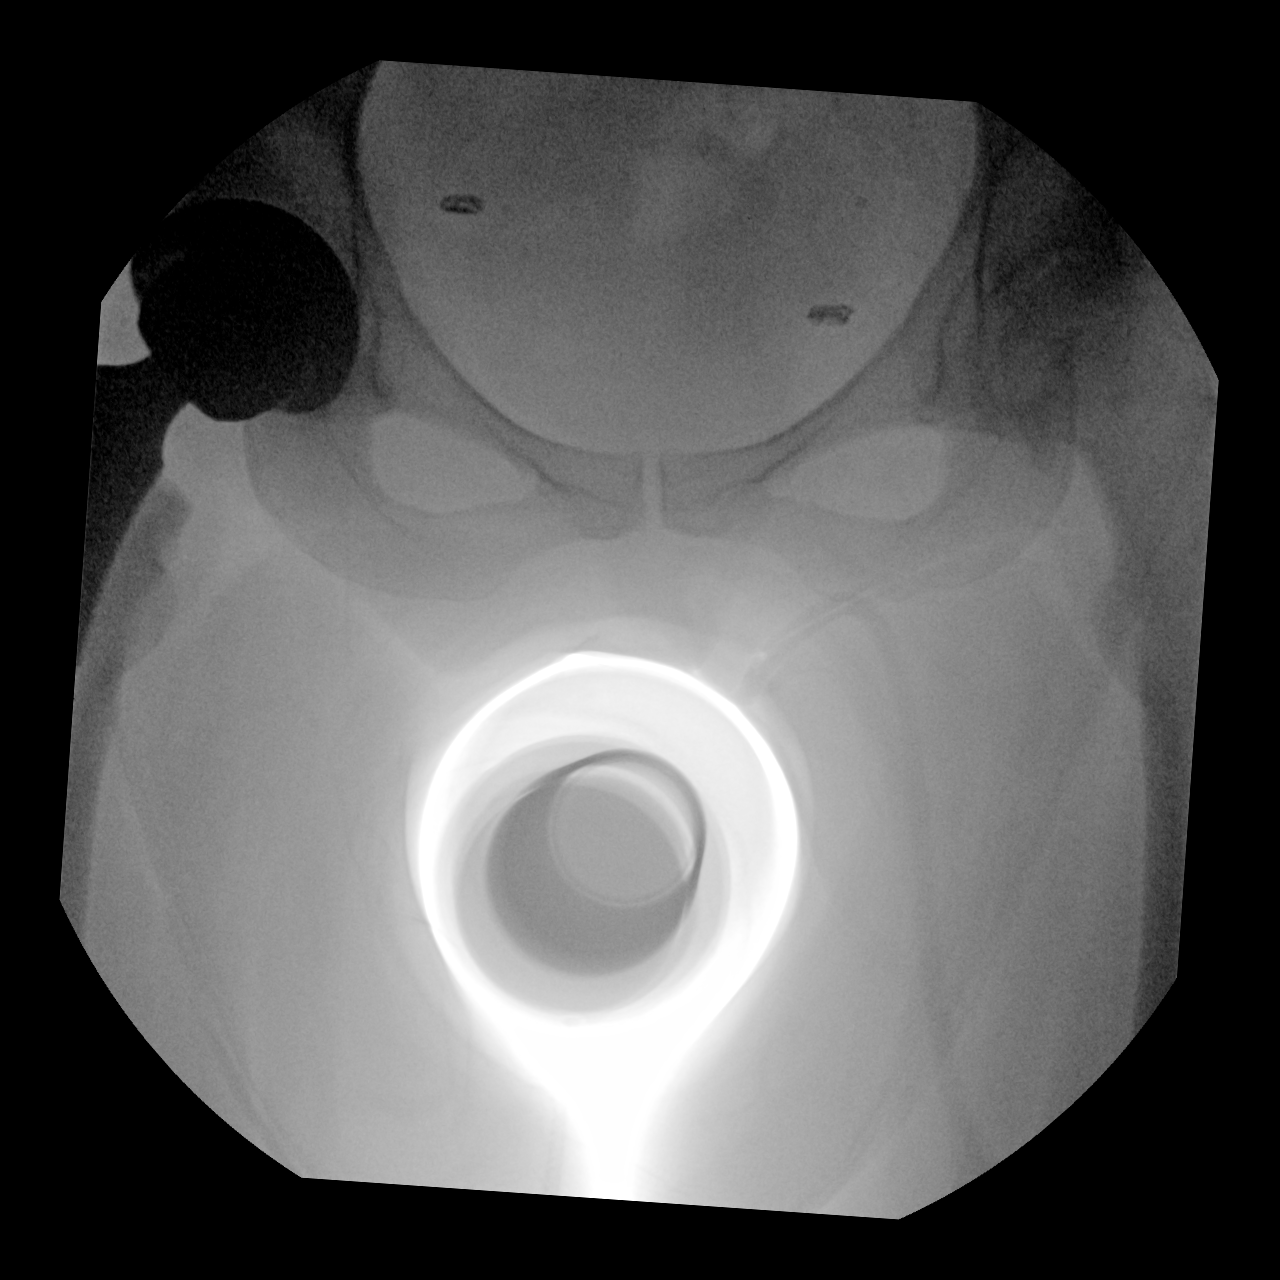

[2 of 2 positions shown; findings below may reference images not displayed]

FINDINGS: RIGHT hip replacement identified.

No fracture, dislocation or bone destruction.
IMPRESSION: RIGHT hip prosthesis without acute complication.

## 2020-11-01 NOTE — Progress Notes (Deleted)
Name: [name]  MRN/ DOB: [id number], 08-04-1980    Age/ Sex: 40 y.o., female    PCP: Kaleen Mask, MD   Reason for Endocrinology Evaluation: Subclinical Hyperthyroidism     Date of Initial Endocrinology Evaluation: 11/01/2020     HPI: [name] is a 40 y.o. female with unremarkable past medical history . The patient presented for initial endocrinology clinic visit on 11/01/2020 for consultative assistance with her Subclinical Hyperthyroidism.   She has been noted subclinical hyperthyroidism since 2020 with low TSH and normal T3 and T4.       HISTORY:  Past Medical History:  Past Medical History:  Diagnosis Date   Anemia yrs ago   Anxiety yrs ago   Arthritis    oa   Depression yrs ago   Past Surgical History:  Past Surgical History:  Procedure Laterality Date   TOTAL HIP ARTHROPLASTY Right 11/11/2017   Procedure: RIGHT TOTAL HIP ARTHROPLASTY ANTERIOR APPROACH;  Surgeon: Gean Birchwood, MD;  Location: [facility];  Service: Orthopedics;  Laterality: Right;   TOTAL HIP ARTHROPLASTY Left 03/03/2018   Procedure: TOTAL HIP ARTHROPLASTY ANTERIOR APPROACH;  Surgeon: Marcene Corning, MD;  Location: [facility];  Service: Orthopedics;  Laterality: Left;   TUBAL LIGATION      Social History:  reports that she has been smoking cigarettes. She has a 6.00 pack-year smoking history. She has never used smokeless tobacco. She reports that she does not currently use alcohol. She reports that she does not currently use drugs. Family History: family history is not on file.   HOME MEDICATIONS: Allergies as of 11/02/2020   No Known Allergies      Medication List        Accurate as of November 01, 2020 11:15 AM. If you have any questions, ask your nurse or doctor.          aspirin 325 MG EC tablet Take 1 tablet (325 mg total) by mouth 2 (two) times daily after a meal.   calcium carbonate 1500 (600 Ca) MG Tabs tablet Commonly known as: OSCAL Take 600 mg by  mouth every 3 (three) days.   celecoxib 200 MG capsule Commonly known as: CELEBREX Take 1 capsule (200 mg total) by mouth 2 (two) times daily.   gabapentin 300 MG capsule Commonly known as: NEURONTIN Take 1 capsule (300 mg total) by mouth 3 (three) times daily.   oxyCODONE-acetaminophen 5-325 MG tablet Commonly known as: PERCOCET/ROXICET Take 1 tablet by mouth every 4 (four) hours as needed for severe pain.   tiZANidine 2 MG tablet Commonly known as: ZANAFLEX Take 1 tablet (2 mg total) by mouth every 6 (six) hours as needed.   vitamin B-12 500 MCG tablet Commonly known as: CYANOCOBALAMIN Take 500 mcg by mouth every 3 (three) days.   vitamin E 180 MG (400 UNITS) capsule Take 400 Units by mouth every 3 (three) days.          REVIEW OF SYSTEMS: A comprehensive ROS was conducted with the patient and is negative except as per HPI and below:  ROS     OBJECTIVE:  VS: There were no vitals taken for this visit.   Wt Readings from Last 3 Encounters:  03/03/18 215 lb (97.5 kg)  11/11/17 205 lb (93 kg)  11/04/17 205 lb (93 kg)     EXAM: General: Pt appears well and is in NAD  Hydration: Well-hydrated with moist mucous membranes and good skin turgor  Eyes: External eye exam normal  without stare, lid lag or exophthalmos.  EOM intact.  PERRL.  Ears, Nose, Throat: Hearing: Grossly intact bilaterally Dental: Good dentition  Throat: Clear without mass, erythema or exudate  Neck: General: Supple without adenopathy. Thyroid: Thyroid size normal.  No goiter or nodules appreciated. No thyroid bruit.  Lungs: Clear with good BS bilat with no rales, rhonchi, or wheezes  Heart: Auscultation: RRR.  Abdomen: Normoactive bowel sounds, soft, nontender, without masses or organomegaly palpable  Extremities: Gait and station: Normal gait  Digits and nails: No clubbing, cyanosis, petechiae, or nodes Head and neck: Normal alignment and mobility BL UE: Normal ROM and strength. BL LE: No  pretibial edema normal ROM and strength.  Skin: Hair: Texture and amount normal with gender appropriate distribution Skin Inspection: No rashes, acanthosis nigricans/skin tags. No lipohypertrophy Skin Palpation: Skin temperature, texture, and thickness normal to palpation  Neuro: Cranial nerves: II - XII grossly intact  Cerebellar: Normal coordination and movement; no tremor Motor: Normal strength throughout DTRs: 2+ and symmetric in UE without delay in relaxation phase  Mental Status: Judgment, insight: Intact Orientation: Oriented to time, place, and person Memory: Intact for recent and remote events Mood and affect: No depression, anxiety, or agitation     DATA REVIEWED: ***  07/01/2020 WBC 9.4 Gluc 86 BUN/Cr 9/0.75 GFR 103 Ca 9.6 A1c 4.9%  TSH 0.269 uIU/mL (0.450-5.0) T3 141 (71-180) TPO Ab <8 IU/mL   01/27/2019  TSH 0.385 uIU/mL     ASSESSMENT/PLAN/RECOMMENDATIONS:   Hyperthyroidism:     Medications :  Signed electronically by: Lyndle Herrlich, MD  [facility]  [facility] [address] [place] [place], [place] [address] Phone: [phone] FAX: [phone]   CC: Kaleen Mask, MD [address] [place] [address] Phone: [phone] Fax: [phone]   Return to Endocrinology clinic as below: Future Appointments  Date Time Provider Department Center  11/02/2020  2:20 PM Mitali Shenefield, Konrad Dolores, MD [id number] None

## 2020-11-02 ENCOUNTER — Ambulatory Visit: Payer: Medicaid Other | Admitting: Internal Medicine

## 2020-12-28 ENCOUNTER — Ambulatory Visit: Payer: Medicaid Other | Admitting: Internal Medicine

## 2022-10-03 ENCOUNTER — Emergency Department (HOSPITAL_COMMUNITY)
Admission: EM | Admit: 2022-10-03 | Discharge: 2022-10-03 | Disposition: A | Discharge status: Home / Self Care | Payer: Medicaid Other | Attending: Emergency Medicine | Admitting: Emergency Medicine

## 2022-10-03 ENCOUNTER — Other Ambulatory Visit: Payer: Self-pay

## 2022-10-03 ENCOUNTER — Ambulatory Visit (HOSPITAL_COMMUNITY)
Admission: EM | Admit: 2022-10-03 | Discharge: 2022-10-03 | Disposition: A | Discharge status: Other Acute Inpatient Hospital | Payer: Medicaid Other | Attending: Nurse Practitioner | Admitting: Nurse Practitioner

## 2022-10-03 ENCOUNTER — Encounter (HOSPITAL_COMMUNITY): Payer: Self-pay | Admitting: Emergency Medicine

## 2022-10-03 DIAGNOSIS — R000 Tachycardia, unspecified: Secondary | ICD-10-CM | POA: Diagnosis not present

## 2022-10-03 DIAGNOSIS — R4182 Altered mental status, unspecified: Secondary | ICD-10-CM | POA: Insufficient documentation

## 2022-10-03 DIAGNOSIS — F29 Unspecified psychosis not due to a substance or known physiological condition: Secondary | ICD-10-CM | POA: Insufficient documentation

## 2022-10-03 DIAGNOSIS — Z79899 Other long term (current) drug therapy: Secondary | ICD-10-CM | POA: Insufficient documentation

## 2022-10-03 DIAGNOSIS — Z7982 Long term (current) use of aspirin: Secondary | ICD-10-CM | POA: Diagnosis not present

## 2022-10-03 DIAGNOSIS — D72829 Elevated white blood cell count, unspecified: Secondary | ICD-10-CM | POA: Insufficient documentation

## 2022-10-03 DIAGNOSIS — Z566 Other physical and mental strain related to work: Secondary | ICD-10-CM | POA: Insufficient documentation

## 2022-10-03 LAB — COMPREHENSIVE METABOLIC PANEL
ALT: 16 U/L (ref 0–44)
AST: 24 U/L (ref 15–41)
Albumin: 3.9 g/dL (ref 3.5–5.0)
Alkaline Phosphatase: 75 U/L (ref 38–126)
Anion gap: 12 (ref 5–15)
BUN: 7 mg/dL (ref 6–20)
CO2: 21 mmol/L — ABNORMAL LOW (ref 22–32)
Calcium: 8.8 mg/dL — ABNORMAL LOW (ref 8.9–10.3)
Chloride: 103 mmol/L (ref 98–111)
Creatinine, Ser: 0.9 mg/dL (ref 0.44–1.00)
GFR, Estimated: >60 mL/min (ref >60)
Glucose, Bld: 119 mg/dL — ABNORMAL HIGH (ref 70–99)
Potassium: 3.3 mmol/L — ABNORMAL LOW (ref 3.5–5.1)
Sodium: 136 mmol/L (ref 135–145)
Total Bilirubin: 1 mg/dL (ref 0.3–1.2)
Total Protein: 7.7 g/dL (ref 6.5–8.1)

## 2022-10-03 LAB — CBC
HCT: 42.1 % (ref 36.0–46.0)
Hemoglobin: 14.3 g/dL (ref 12.0–15.0)
MCH: 34.4 pg — ABNORMAL HIGH (ref 26.0–34.0)
MCHC: 34 g/dL (ref 30.0–36.0)
MCV: 101.2 fL — ABNORMAL HIGH (ref 80.0–100.0)
Platelets: 280 10*3/uL (ref 150–400)
RBC: 4.16 MIL/uL (ref 3.87–5.11)
RDW: 14.3 % (ref 11.5–15.5)
WBC: 11.8 10*3/uL — ABNORMAL HIGH (ref 4.0–10.5)
nRBC: 0 % (ref 0.0–0.2)

## 2022-10-03 LAB — RAPID URINE DRUG SCREEN, HOSP PERFORMED
Amphetamines: NOT DETECTED
Barbiturates: NOT DETECTED
Benzodiazepines: NOT DETECTED
Cocaine: NOT DETECTED
Opiates: NOT DETECTED
Tetrahydrocannabinol: POSITIVE — AB

## 2022-10-03 LAB — SALICYLATE LEVEL: Salicylate Lvl: <7 mg/dL — ABNORMAL LOW (ref 7.0–30.0)

## 2022-10-03 LAB — ETHANOL: Alcohol, Ethyl (B): <10 mg/dL (ref <10)

## 2022-10-03 LAB — ACETAMINOPHEN LEVEL: Acetaminophen (Tylenol), Serum: <10 ug/mL — ABNORMAL LOW (ref 10–30)

## 2022-10-03 LAB — HCG, SERUM, QUALITATIVE: Preg, Serum: NEGATIVE

## 2022-10-03 MED ORDER — KETOROLAC TROMETHAMINE 10 MG PO TABS
10.0000 mg | ORAL_TABLET | Freq: Once | ORAL | Status: AC
  Administered 2022-10-03: 10 mg via ORAL
  Filled 2022-10-03: qty 1

## 2022-10-03 MED ORDER — LORAZEPAM 1 MG PO TABS
1.0000 mg | ORAL_TABLET | Freq: Once | ORAL | Status: AC
  Administered 2022-10-03: 1 mg via ORAL
  Filled 2022-10-03: qty 1

## 2022-10-03 NOTE — ED Provider Notes (Cosign Needed Addendum)
[facility]al [facility] Urgent [facility] Medical Screening Exam  Patient Name: [name] MRN: [id number] Date of Evaluation: 10/03/22 Chief Complaint:  bizarre behavior AVH Diagnosis:  Final diagnoses:  Psychosis, unspecified psychosis type (HCC)    History of Present illness: [name] is a married 42 y.o. female presenting to [facility] acute onset of psychosis with her husband [name]. Patient's husband reports that for the past 24 hours patient was talking to herself, claiming that she is Oprah, not responding to him and staring off in a daze.   Nurse practitioner assessed patient face to face and reviewed her chart. Patient is alert, oriented x4, some irritability directed towards husband, speech is pressured, thought process is tangential with some possible thought blocking occurring. Patient is sitting in the assessment room with her husband and has her eyes closed. When patient was asked what brought her to [facility] patient states that, "God brought me here". When asked for what reason, patient just sits with her eyes closed and does not answer the question. Patient does deny any SI/HI or AVH. Patient appears that she maybe responding some internal stimuli that could possibly be substance induced.   Patient's husband provides most of the history as patient just sits with her eyes closed and does respond. When patient does respond she states she wants to go and relax, when she talks her husband does not listen to her. 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  Husband reports that patient also has been in a lot of pain with a pinched nerve in her arm and is currently out of work due to the stress from work and the pain.  Husband reports the patient has not slept in about 4 to 5 days and that she smokes about 1-2 marijuana blunts daily.  Husband reports that patient has been hospitalized in 2012 or 2013 at [facility] and had a few follow up visits with Dr. Lolly Mustache. Per chart review it appears last visit was in 2008. Patient's husband reports that patient has been stable and functioning well until about 24 hours a go. Husband reports that patient is currently prescribed tizandine, meloxican and gabapentin by her PCP but has not taken the gabapentin for some time.   Patient is in agreement to go the emergency room and will be sent out to [facility] for medical clearance due to sudden onset of change in mental status. Patient may return to [facility] once medically cleared.   Flowsheet Row ED from 10/03/2022 in [facility]  C-SSRS RISK CATEGORY No Risk       Psychiatric Specialty Exam  Presentation  General Appearance:Casual  Eye Contact:Fleeting  Speech:Pressured  Speech Volume:Increased  Handedness:Right   Mood and Affect  Mood: Irritable  Affect: Blunt   Thought Process  Thought Processes: Disorganized  Descriptions of Associations:Intact  Orientation:Full (Time, Place and Person)  Thought Content:Tangential; Rumination    Hallucinations:None  Ideas of Reference:None  Suicidal Thoughts:No  Homicidal Thoughts:No   Sensorium  Memory: Immediate Fair; Recent Fair; Remote Fair  Judgment: Poor  Insight: Poor   Executive Functions  Concentration: Fair  Attention Span: Fair  Recall: Fair  Fund of Knowledge: Fair  Language: Good   Psychomotor Activity  Psychomotor Activity: Normal   Assets  Assets: Psychologist, occupational; Desire for Improvement; Housing; Physical Health   Sleep  Sleep: Poor  Number of hours:  0  Physical Exam: Physical Exam HENT:     Head: Normocephalic and atraumatic.     Nose: Nose normal.  Eyes:     Pupils: Pupils are equal, round, and reactive to light.  Cardiovascular:     Rate and Rhythm: Normal rate.  Pulmonary:     Effort: Pulmonary effort is normal.   Abdominal:     General: Abdomen is flat.  Musculoskeletal:        General: Normal range of motion.     Cervical back: Normal range of motion.  Skin:    General: Skin is warm.  Neurological:     Mental Status: She is alert and oriented to person, place, and time.  Psychiatric:        Attention and Perception: She is inattentive.        Mood and Affect: Mood is anxious. Affect is labile.        Speech: Speech is rapid and pressured.        Behavior: Behavior is agitated.        Thought Content: Thought content is not paranoid or delusional. Thought content does not include homicidal or suicidal ideation. Thought content does not include homicidal or suicidal plan.        Cognition and Memory: Cognition normal.        Judgment: Judgment is impulsive.    Review of Systems  Constitutional: Negative.   HENT: Negative.    Eyes: Negative.   Respiratory: Negative.    Cardiovascular: Negative.   Genitourinary: Negative.   Musculoskeletal: Negative.   Skin: Negative.   Neurological: Negative.   Endo/Heme/Allergies: Negative.   Psychiatric/Behavioral:  The patient is nervous/anxious.    Blood pressure 109/81, pulse (!) 101, temperature 98.6 F (37 C), resp. rate 16, SpO2 100%. There is no height or weight on file to calculate BMI.  Musculoskeletal: Strength & Muscle Tone: within normal limits Gait & Station: normal Patient leans: N/A   [facility] MSE Discharge Disposition for Follow up and Recommendations: Based on my evaluation the patient appears to have an emergency medical condition for which I recommend the patient be transferred to the emergency department for further evaluation.    Jasper Riling, NP 10/03/2022, 2:43 PM

## 2022-10-03 NOTE — BH Assessment (Addendum)
Comprehensive Clinical Assessment (CCA) Note  10/03/2022 [name] [id number]  Disposition: Per Roselyn Bering, NP ED medical clearance is recommended due to recent onset of AMS.   The patient demonstrates the following risk factors for suicide: Chronic risk factors for suicide include: psychiatric disorder of Psychosis Unspecified . Acute risk factors for suicide include: social withdrawal/isolation. Protective factors for this patient include: positive social support, positive therapeutic relationship, responsibility to others (children, family), and hope for the future. Considering these factors, the overall suicide risk at this point appears to be low. Patient is appropriate for outpatient follow up, once stabilized.   Patient is a 42 year old female who presents voluntarily, accompanied by her husband, to [facility] for assessment.   Patient is quite disorganized on arrival.  She is observed rocking back and forth throughout the assessment, sticking her tongue in and out of her mouth at points.  Patient provides minimal responses and appears agitated, as evidenced by long and loud sighs with clinched fists.  Several times, patient stands abruptly while making statements about God sending her here. Patient struggles to provide meaningful responses during the assessment.  She displays poor insight into her current symptoms and presentation.  She denies SI and HI.  She admitted to hearing voices telling her "to do things."  Patient preferred that her husband stay for the assessment and she allowed him to share concerns.     Patient's husband reports that "something shifted" yesterday while they were watching a movie.  He noticed she stopped responding to him and appeared to be in a "daze."  She then began to have loud conversations with people who weren't there.  He reports she was screaming, yelling and banging on the wall.  Patient's husband reports patient has not slept in  the past 5 days.  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 In addition to feeling stress in her current role, patient has also been in a lot of pain with a pinched nerve in her arm and is currently out of work due to the stress from work and the pain.  She is scheduled to have an injection for the pain soon.  He reports patient lost her mother, on their son's birthday 3 years ago.  He believes the upcoming anniversary could be contributing to her current symptoms.  He reports that prior to yesterday, patient was functioning fairly normally.  Patient's husband reports patient's PCP manages her medications.  He is aware that patient is out of her Gabapentin, however he is uncertain as to how many days she has been off of it.  She has continued Tizanidine as prescribed.  Patient's husband shares that patient has had two prior similar episodes in 2009 and 2012.  She was admitted for inpatient psychiatric treatment following these episodes.  Patient's husband reports patient has not followed up with outpatient psychiatric treatment following these admissions, stating "she was fine and she did okay for years" following the hospitalizations.    Chief Complaint:  Chief Complaint  Patient presents with   Manic Behavior   Visit Diagnosis: Psychosis Unspecified    CCA Screening, Triage and Referral (STR)  Patient Reported Information How did you hear about [place]? Family What Is the Reason for Your Visit/Call Today? Pt presents to [facility] voluntarily accompanied by her husband. Pt is presenting with worsening uncontrolled anxiety and expressing manic behavior . Pts husband reports that  her behavior has been happening within the past 24 hours. Pts husband reports that her last outburst (similar to this one) has been years ago. Pt appears to be sticking her tounge in and out every few minutes and continuing to "rock herself back and  forth". Pt states, "I was told that I have been screaming and yelling." Pt reports that she is fine, but is not according to her husband. Pts husband reports that she is not remembering what she is saying and that she has had increased anxiety. Pt is agitated, manic and pressured speech. Pt reports to be on medication for pain and smoked weed 24 hrs ago. Pt reports she does hear voices when she sleeps at night telling her "to do things". Pt denies alcohol use, SI, HI and VH.  How Long Has This Been Causing You Problems? <Week  What Do You Feel Would Help You the Most Today? Stress Management; Treatment for Depression or other mood problem   Have You Recently Had Any Thoughts About Hurting Yourself? No  Are You Planning to Commit Suicide/Harm Yourself At This time? No   Flowsheet Row ED from 10/03/2022 in [facility]  C-SSRS RISK CATEGORY No Risk       Have you Recently Had Thoughts About Hurting Someone Karolee Ohs? No  Are You Planning to Harm Someone at This Time? No  Explanation: N/A  Have You Used Any Alcohol or Drugs in the Past 24 Hours? Yes  What Did You Use and How Much? one blunt   Do You Currently Have a Therapist/Psychiatrist? No  Name of Therapist/Psychiatrist: Name of Therapist/Psychiatrist: N/A   Have You Been Recently Discharged From Any Office Practice or Programs? No  Explanation of Discharge From Practice/Program: N/A     CCA Screening Triage Referral Assessment Type of Contact: Face-to-Face  Telemedicine Service Delivery:   Is this Initial or Reassessment?   Date Telepsych consult ordered in [facility]:    Time Telepsych consult ordered in [facility]:    Location of Assessment: [facility] [address] [facility] Assessment Services  Provider Location: [facility] Assessment Services   Collateral Involvement: Husband is present and he provided collateral.   Does Patient Have a Automotive engineer Guardian? No  Legal Guardian Contact Information: N/A  Copy of Legal  Guardianship Form: -- (N/A)  Legal Guardian Notified of Arrival: -- (N/A)  Legal Guardian Notified of Pending Discharge: -- (N/A)  If Minor and Not Living with Parent(s), Who has Custody? N/A  Is CPS involved or ever been involved? Never  Is APS involved or ever been involved? Never   Patient Determined To Be At Risk for Harm To Self or Others Based on Review of Patient Reported Information or Presenting Complaint? No  Method: -- (N/A, No HI)  Availability of Means: -- (N/A, No HI)  Intent: -- (N/A, No HI)  Notification Required: -- (N/A, No HI)  Additional Information for Danger to Others Potential: -- (N/A, No HI)  Additional Comments for Danger to Others Potential: N/A, No HI  Are There Guns or Other Weapons in Your Home? No  Types of Guns/Weapons: N/A  Are These Weapons Safely Secured?                            -- (N/A)  Who Could Verify You Are Able To Have These Secured: N/A  Do You Have any Outstanding Charges, Pending Court Dates, Parole/Probation? None  Contacted To Inform of Risk of  Harm To Self or Others: Family/Significant Other:    Does Patient Present under Involuntary Commitment? No    [place] of Residence: [place]   Patient Currently Receiving the Following Services: Not Receiving Services   Determination of Need: Urgent (48 hours)   Options For Referral: Medication Management; Inpatient Hospitalization; BH Urgent Care     CCA Biopsychosocial Patient Reported Schizophrenia/Schizoaffective Diagnosis in Past: No   Strengths: Patient has family support, husband is present and quite supportive   Mental Health Symptoms Depression:   Difficulty Concentrating; Irritability   Duration of Depressive symptoms:  Duration of Depressive Symptoms: Greater than two weeks   Mania:   Racing thoughts; Overconfidence; Irritability; Increased Energy   Anxiety:    Worrying; Tension; Sleep; Restlessness   Psychosis:  AH, recent onset   Duration of Psychotic symptoms: 24 hours  Trauma:   None   Obsessions:   None   Compulsions:   None   Inattention:   N/A   Hyperactivity/Impulsivity:   N/A   Oppositional/Defiant Behaviors:   N/A   Emotional Irregularity:   Mood lability; Transient, stress-related paranoia/disassociation   Other Mood/Personality Symptoms:   Worsening psychosis, manic sx onset yesterday    Mental Status Exam Appearance and self-care  Stature:   Average   Weight:   Average weight   Clothing:   Casual   Grooming:   Normal   Cosmetic use:   Age appropriate   Posture/gait:   Tense   Motor activity:   Restless   Sensorium  Attention:   Vigilant   Concentration:   Focuses on irrelevancies; Preoccupied   Orientation:   Person   Recall/memory:   Defective in Immediate; Defective in Short-term   Affect and Mood  Affect:   Labile   Mood:   Hypomania; Irritable   Relating  Eye contact:   Fleeting   Facial expression:   Constricted; Tense   Attitude toward examiner:   Defensive; Irritable   Thought and Language  Speech flow:  Flight of Ideas; Pressured   Thought content:   Ideas of Influence; Delusions   Preoccupation:   None   Hallucinations:   Auditory   Organization:   Disorganized; Environmental manager of Knowledge:   Average   Intelligence:   Average   Abstraction:   Functional   Judgement:   Impaired   Reality Testing:   Distorted   Insight:   Lacking; Gaps   Decision Making:   Confused; Vacilates   Social Functioning  Social Maturity:   Responsible   Social Judgement:   Normal   Stress  Stressors:   Work; Illness; Grief/losses   Coping Ability:   Exhausted   Skill Deficits:   Communication; Interpersonal; Self-control   Supports:   Family     Religion: Religion/Spirituality Are You A Religious Person?: No How Might This Affect Treatment?: N/A  Leisure/Recreation: Leisure /  Recreation Do You Have Hobbies?: No  Exercise/Diet: Exercise/Diet Do You Exercise?: No Have You Gained or Lost A Significant Amount of Weight in the Past Six Months?: No Do You Follow a Special Diet?: No Do You Have Any Trouble Sleeping?: Yes Explanation of Sleeping Difficulties: Per husband, patient has not slept in 5 days   CCA Employment/Education Employment/Work Situation: Employment / Work Situation Employment Situation: Employed Work Stressors: Patient works in a call center for [employer] - describes it as "very stressful," however does not elaborate. Patient's Job has Been Impacted by Current Illness: Yes Describe how  Patient's Job has Been Impacted: Patient is currently on leave due to pain and stress related issues. Has Patient ever Been in the [employer]?: No  Education: Education Is Patient Currently Attending School?: No Last Grade Completed: 12 Did You Attend College?: No Did You Have An Individualized Education Program (IIEP): No Did You Have Any Difficulty At School?: No Patient's Education Has Been Impacted by Current Illness: No   CCA Family/Childhood History Family and Relationship History: Family history Marital status: Married Number of Years Married:  (NA) What types of issues is patient dealing with in the relationship?: No concerns reported Additional relationship information: NA Does patient have children?: Yes How many children?: 2 How is patient's relationship with their children?: 16 y.o.daughter and 42 y.o. son - no concerns reported  Childhood History:  Childhood History By whom was/is the patient raised?: Both parents Did patient suffer any verbal/emotional/physical/sexual abuse as a child?: No Did patient suffer from severe childhood neglect?: No Has patient ever been sexually abused/assaulted/raped as an adolescent or adult?: No Was the patient ever a victim of a crime or a disaster?: No Witnessed domestic violence?: No Has patient  been affected by domestic violence as an adult?: No       CCA Substance Use Alcohol/Drug Use: Alcohol / Drug Use Pain Medications: See MAR Prescriptions: See MAR Over the Counter: See MAR History of alcohol / drug use?: Yes Longest period of sobriety (when/how long): N/A Withdrawal Symptoms: None Substance #1 Name of Substance 1: THC 1 - Age of First Use: unknown 1 - Amount (size/oz): 2 blunts 1 - Frequency: daily 1 - Duration: "years" 1 - Last Use / Amount: yesterday - 2 blunts 1 - Method of Aquiring: NA 1- Route of Use: smokes                       ASAM's:  Six Dimensions of Multidimensional Assessment  Dimension 1:  Acute Intoxication and/or Withdrawal Potential:   Dimension 1:  Description of individual's past and current experiences of substance use and withdrawal: N/A  Dimension 2:  Biomedical Conditions and Complications:   Dimension 2:  Description of patient's biomedical conditions and  complications: Pain issues related to osteoarthritis.  Dimension 3:  Emotional, Behavioral, or Cognitive Conditions and Complications:  Dimension 3:  Description of emotional, behavioral, or cognitive conditions and complications: Unclear mental health hx : pt has hx of similar episodes, however no psych dx in chart  Dimension 4:  Readiness to Change:  Dimension 4:  Description of Readiness to Change criteria: No insight into THC being a psychoactive substance  Dimension 5:  Relapse, Continued use, or Continued Problem Potential:  Dimension 5:  Relapse, continued use, or continued problem potential critiera description: limited understanding of MI and SA related issues  Dimension 6:  Recovery/Living Environment:  Dimension 6:  Recovery/Iiving environment criteria description: Husband supportive of patient, however he is not concerned about patient's THC use.  ASAM Severity Score: ASAM's Severity Rating Score: 11  ASAM Recommended Level of Treatment: ASAM Recommended Level of  Treatment: Level I Outpatient Treatment   Substance use Disorder (SUD) Substance Use Disorder (SUD)  Checklist Symptoms of Substance Use: Continued use despite having a persistent/recurrent physical/psychological problem caused/exacerbated by use, Persistent desire or unsuccessful efforts to cut down or control use, Presence of craving or strong urge to use  Recommendations for Services/Supports/Treatments: Recommendations for Services/Supports/Treatments Recommendations For Services/Supports/Treatments: Other (Comment) (ED - med clearance with observation period)  Discharge Disposition:  DSM5 Diagnoses: Patient Active Problem List   Diagnosis Date Noted   Primary osteoarthritis of left hip 03/03/2018   Primary osteoarthritis of right hip 11/11/2017   Osteoarthritis of left hip 11/08/2017     Referrals to Alternative Service(s): Referred to Alternative Service(s):   Place:   Date:   Time:    Referred to Alternative Service(s):   Place:   Date:   Time:    Referred to Alternative Service(s):   Place:   Date:   Time:    Referred to Alternative Service(s):   Place:   Date:   Time:     Yetta Glassman, [facility]

## 2022-10-03 NOTE — ED Provider Notes (Signed)
[place] EMERGENCY DEPARTMENT AT [facility] Provider Note   CSN: [id number] Arrival date & time: 10/03/22  1445     History  No chief complaint on file.   [name] is a 42 y.o. female.  Patient with history of cervical radiculopathy, chronic pain in her right arm.  She presented to behavioral health urgent care today due to psychosis.  She has reportedly not been sleeping.  She has been saying unusual things.  Please see same-day [facility] note for further details. Family member at bedside states that it seemed that she was going to have a nervous breakdown, prompting being seen at [facility].  Patient was sent to the emergency department for medical clearance.  Patient reports taking tizanidine at home.  She has been prescribed gabapentin, but per behavioral health note, patient has not been taking this.  Reports marijuana use, no other drugs reported.       Home Medications Prior to Admission medications   Medication Sig Start Date End Date Taking? Authorizing Provider  aspirin EC 325 MG EC tablet Take 1 tablet (325 mg total) by mouth 2 (two) times daily after a meal. 03/04/18   Allena Katz, PA-C  calcium carbonate (OSCAL) 1500 (600 Ca) MG TABS tablet Take 600 mg by mouth every 3 (three) days.    [provider]  celecoxib (CELEBREX) 200 MG capsule Take 1 capsule (200 mg total) by mouth 2 (two) times daily. 03/04/18   Allena Katz, PA-C  gabapentin (NEURONTIN) 300 MG capsule Take 1 capsule (300 mg total) by mouth 3 (three) times daily. 03/04/18   Allena Katz, PA-C  oxyCODONE-acetaminophen (PERCOCET/ROXICET) 5-325 MG tablet Take 1 tablet by mouth every 4 (four) hours as needed for severe pain. 03/04/18   Allena Katz, PA-C  tiZANidine (ZANAFLEX) 2 MG tablet Take 1 tablet (2 mg total) by mouth every 6 (six) hours as needed. 03/04/18   Allena Katz, PA-C  vitamin B-12 (CYANOCOBALAMIN) 500 MCG tablet Take 500 mcg by mouth every 3 (three) days.     [provider]  vitamin E 400 UNIT capsule Take 400 Units by mouth every 3 (three) days.    [provider]      Allergies    Patient has no known allergies.    Review of Systems   Review of Systems  Physical Exam Updated Vital Signs BP (!) 168/97   Pulse (!) 140   Temp 98.3 F (36.8 C) (Oral)   Resp 20   SpO2 98%  Physical Exam Vitals and nursing note reviewed.  Constitutional:      Appearance: She is well-developed.  HENT:     Head: Normocephalic and atraumatic.  Eyes:     Conjunctiva/sclera: Conjunctivae normal.  Pulmonary:     Effort: No respiratory distress.  Musculoskeletal:     Cervical back: Normal range of motion and neck supple.  Skin:    General: Skin is warm and dry.  Neurological:     Mental Status: She is alert.  Psychiatric:        Mood and Affect: Affect is angry.     Comments: Patient sitting in exam bed with eyes closed. Family member at bedside provides majority of history as patient does not appear to be interested in providing additional information. She is not saying in appropriate things, after being encouraged to respond several times by family member.      ED Results / Procedures / Treatments   Labs (all labs  ordered are listed, but only abnormal results are displayed) Labs Reviewed  COMPREHENSIVE METABOLIC PANEL - Abnormal; Notable for the following components:      Result Value   Potassium 3.3 (*)    CO2 21 (*)    Glucose, Bld 119 (*)    Calcium 8.8 (*)    All other components within normal limits  SALICYLATE LEVEL - Abnormal; Notable for the following components:   Salicylate Lvl <7.0 (*)    All other components within normal limits  ACETAMINOPHEN LEVEL - Abnormal; Notable for the following components:   Acetaminophen (Tylenol), Serum <10 (*)    All other components within normal limits  CBC - Abnormal; Notable for the following components:   WBC 11.8 (*)    MCV 101.2 (*)    MCH 34.4 (*)    All other  components within normal limits  RAPID URINE DRUG SCREEN, HOSP PERFORMED - Abnormal; Notable for the following components:   Tetrahydrocannabinol POSITIVE (*)    All other components within normal limits  ETHANOL  HCG, SERUM, QUALITATIVE    ED ECG REPORT   Date: 10/03/2022  Rate: 122  Rhythm: sinus tachycardia  QRS Axis: normal  Intervals: normal  ST/T Wave abnormalities: normal  Conduction Disutrbances:none  Narrative Interpretation:   Old EKG Reviewed: changes noted, faster today  I have personally reviewed the EKG tracing and agree with the computerized printout as noted.   Radiology No results found.  Procedures Procedures    Medications Ordered in ED Medications - No data to display  ED Course/ Medical Decision Making/ A&P    Patient seen and examined. History obtained directly from patient, family at bedside, [facility] interview performed prior to arrival.   Labs/EKG: Medical clearance workup ordered in triage.  Awaiting results.  I added EKG due to tachycardia noted on arrival.  Imaging: None ordered  Medications/Fluids: None ordered  Most recent vital signs reviewed and are as follows: BP (!) 168/97   Pulse (!) 140   Temp 98.3 F (36.8 C) (Oral)   Resp 20   SpO2 98%   Initial impression: Psychosis, insomnia, chronic pain  5:25 PM Reassessment performed. Patient appears unchanged.  Additional family members now at bedside.  They are adamant about patient receiving something to help her relax, reiterating that she has not slept.  We did discuss that this is a difficult situation given the fact that she was sent over for altered mental status and giving sedating medications may confound the exam.  They continue to be adamant about her getting something to help her rest.  I will give 1 mg of p.o. Ativan.  We discussed that this may ultimately delay further evaluation if she continues to be sedated.  Family members verbalized understanding.  Labs personally  reviewed and interpreted including: CBC white blood cell count minimally elevated 11.8, MCV chronically elevated at 101.2 today otherwise unremarkable; CMP minimally low potassium at 3.3, glucose 119, normal kidney function, normal BUN, normal liver function tests; ethanol negative; acetaminophen and salicylate levels are negative.  Awaiting EKG, UDS.  Reviewed pertinent lab work and imaging with patient at bedside. Questions answered.   Most current vital signs reviewed and are as follows: BP 100/73 (BP Location: Left Arm)   Pulse 83   Temp 98.1 F (36.7 C) (Oral)   Resp 18   LMP  (LMP Unknown)   SpO2 95%   Plan: Complete medical clearance, after discussion with family per above, will give p.o. Ativan.  6:51 PM  Reassessment performed. Patient appears stable. EKG reviewed.   Labs personally reviewed and interpreted including: UDS THC only.   Most current vital signs reviewed and are as follows: BP 129/72   Pulse (!) 125   Temp 99.5 F (37.5 C) (Oral)   Resp 19   Ht 5\' 3"  (1.6 m)   Wt 97.5 kg   SpO2 98%   BMI 38.08 kg/m   Plan: TTS eval. Pt  medically clear. Will continue to monitor tachycardia, but is sinus tach.   7:40 PM called the bedside.  Patient is requesting discharge.  Her speech is coherent and we discussed her workup once again.  Encouraged her to stay for behavioral health evaluation, but she declines multiple times.  Family at bedside agrees to take her home and watch her.  Encouraged return back to the ED or [facility] if they change her mind or if symptoms worsen.  Encouraged PCP follow-up in the next 2 days, return with worsening.  At time of discharge patient appears to have insight into her medical decisions.  She is acting appropriately.  Family members appear supportive.  No indication for involuntary commitment at this time.                                 Medical Decision Making Amount and/or Complexity of Data Reviewed Labs: ordered.  Risk Prescription  drug management.   Patient presented to behavioral health earlier today for bizarre behaviors.  Concern for psychosis.  May be related to medications, not sleeping due to chronic pain, underlying psychiatric illnesses.  Patient was transferred for medical clearance.  Medical clearance workup unrevealing.  Patient decided that she wanted to go home and not be in the emergency department anymore.  No indication for involuntary commitment.  Patient does not appear to be in acute danger to herself or others.  She appears to have medical decision making capacity.  Family at bedside, appears supportive.        Final Clinical Impression(s) / ED Diagnoses Final diagnoses:  Altered mental status, unspecified altered mental status type  Sinus tachycardia    Rx / DC Orders ED Discharge Orders     None         Renne Crigler, Cordelia Poche 10/03/22 1946    Gerhard Munch, MD 10/03/22 2321

## 2022-10-03 NOTE — BH Assessment (Deleted)
Comprehensive Clinical Assessment (CCA) Note  10/03/2022 Paiten [name] [id number]  Disposition: Per Roselyn Bering, NP ED medical clearance is recommended due to recent onset of AMS.   The patient demonstrates the following risk factors for suicide: Chronic risk factors for suicide include: psychiatric disorder of Psychosis Unspecified . Acute risk factors for suicide include: social withdrawal/isolation. Protective factors for this patient include: positive social support, positive therapeutic relationship, responsibility to others (children, family), and hope for the future. Considering these factors, the overall suicide risk at this point appears to be low. Patient is appropriate for outpatient follow up, once stabilized.   Patient is a 42 year old female who presents voluntarily, accompanied by her husband, to [facility] for assessment.   Patient is quite disorganized on arrival.  She is observed rocking back and forth throughout the assessment, sticking her tongue in and out of her mouth at points.  Patient provides minimal responses and appears agitated, as evidenced by long and loud sighs with clinched fists.  Several times, patient stands abruptly while making statements about God sending her here. Patient struggles to provide meaningful responses during the assessment.  She displays poor insight into her current symptoms and presentation.  She denies SI and HI.  She admitted to hearing voices telling her "to do things."  Patient preferred that her husband stay for the assessment and she allowed him to share concerns.     Patient's husband reports that "something shifted" yesterday while they were watching a movie.  He noticed she stopped responding to him and appeared to be in a "daze."  She then began to have loud conversations with people who weren't there.  He reports she was screaming, yelling and banging on the wall.  Patient's husband reports patient has not slept in  the past 5 days.  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 In addition to feeling stress in her current role, patient has also been in a lot of pain with a pinched nerve in her arm and is currently out of work due to the stress from work and the pain.  She is scheduled to have an injection for the pain soon.  He reports patient lost her mother, on their son's birthday 3 years ago.  He believes the upcoming anniversary could be contributing to her current symptoms.  He reports that prior to yesterday, patient was functioning fairly normally.  Patient's husband reports patient's PCP manages her medications.  He is aware that patient is out of her Gabapentin, however he is uncertain as to how many days she has been off of it.  She has continued Tizanidine as prescribed.  Patient's husband shares that patient has had two prior similar episodes in 2009 and 2012.  She was admitted for inpatient psychiatric treatment following these episodes.  Patient's husband reports patient has not followed up with outpatient psychiatric treatment following these admissions, stating "she was fine and she did okay for years" following the hospitalizations.    Chief Complaint:  Chief Complaint  Patient presents with   Manic Behavior   Visit Diagnosis: Psychosis Unspecified    CCA Screening, Triage and Referral (STR)  Patient Reported Information How did you hear about [place]? No data recorded What Is the Reason for Your Visit/Call Today? Pt presents to [facility] voluntarily accompanied by her husband. Pt is presenting with worsening uncontrolled anxiety and expressing manic behavior . Pts husband  reports that her behavior has been happening within the past 24 hours. Pts husband reports that her last outburst (similar to this one) has been years ago. Pt appears to be sticking her tounge in and out every few minutes and continuing to "rock herself  back and forth". Pt states, "I was told that I have been screaming and yelling." Pt reports that she is fine, but is not according to her husband. Pts husband reports that she is not remembering what she is saying and that she has had increased anxiety. Pt is agitated, manic and pressured speech. Pt reports to be on medication for pain and smoked weed 24 hrs ago. Pt reports she does hear voices when she sleeps at night telling her "to do things". Pt denies alcohol use, SI, HI and VH.  How Long Has This Been Causing You Problems? <Week  What Do You Feel Would Help You the Most Today? Stress Management; Treatment for Depression or other mood problem   Have You Recently Had Any Thoughts About Hurting Yourself? No  Are You Planning to Commit Suicide/Harm Yourself At This time? No   Flowsheet Row ED from 10/03/2022 in [facility]  C-SSRS RISK CATEGORY No Risk       Have you Recently Had Thoughts About Hurting Someone Karolee Ohs? No  Are You Planning to Harm Someone at This Time? No  Explanation: No data recorded  Have You Used Any Alcohol or Drugs in the Past 24 Hours? Yes  What Did You Use and How Much? one blunt   Do You Currently Have a Therapist/Psychiatrist? No data recorded Name of Therapist/Psychiatrist:    Have You Been Recently Discharged From Any Office Practice or Programs? No data recorded Explanation of Discharge From Practice/Program: No data recorded    CCA Screening Triage Referral Assessment Type of Contact: No data recorded Telemedicine Service Delivery:   Is this Initial or Reassessment?   Date Telepsych consult ordered in [facility]:    Time Telepsych consult ordered in [facility]:    Location of Assessment: No data recorded Provider Location: No data recorded  Collateral Involvement: No data recorded  Does Patient Have a Court Appointed Legal Guardian? No data recorded Legal Guardian Contact Information: No data recorded Copy of Legal  Guardianship Form: No data recorded Legal Guardian Notified of Arrival: No data recorded Legal Guardian Notified of Pending Discharge: No data recorded If Minor and Not Living with Parent(s), Who has Custody? No data recorded Is CPS involved or ever been involved? No data recorded Is APS involved or ever been involved? No data recorded  Patient Determined To Be At Risk for Harm To Self or Others Based on Review of Patient Reported Information or Presenting Complaint? No data recorded Method: No data recorded Availability of Means: No data recorded Intent: No data recorded Notification Required: No data recorded Additional Information for Danger to Others Potential: No data recorded Additional Comments for Danger to Others Potential: No data recorded Are There Guns or Other Weapons in Your Home? No data recorded Types of Guns/Weapons: No data recorded Are These Weapons Safely Secured?                            No data recorded Who Could Verify You Are Able To Have These Secured: No data recorded Do You Have any Outstanding Charges, Pending Court Dates, Parole/Probation? No data recorded Contacted To Inform of Risk of Harm To Self or Others:  No data recorded   Does Patient Present under Involuntary Commitment? No data recorded   [place] of Residence: No data recorded  Patient Currently Receiving the Following Services: No data recorded  Determination of Need: Urgent (48 hours)   Options For Referral: Medication Management; Inpatient Hospitalization     CCA Biopsychosocial Patient Reported Schizophrenia/Schizoaffective Diagnosis in Past: No   Strengths: Patient has family support, husband is present and quite supportive   Mental Health Symptoms Depression:   Difficulty Concentrating; Irritability   Duration of Depressive symptoms:  Duration of Depressive Symptoms: Greater than two weeks   Mania:   Racing thoughts; Overconfidence; Irritability; Increased Energy    Anxiety:    Worrying; Tension; Sleep; Restlessness   Psychosis:  No data recorded  Duration of Psychotic symptoms:    Trauma:   None   Obsessions:   None   Compulsions:   None   Inattention:   N/A   Hyperactivity/Impulsivity:   N/A   Oppositional/Defiant Behaviors:   N/A   Emotional Irregularity:   Mood lability; Transient, stress-related paranoia/disassociation   Other Mood/Personality Symptoms:   Worsening psychosis, manic sx onset yesterday    Mental Status Exam Appearance and self-care  Stature:   Average   Weight:   Average weight   Clothing:   Casual   Grooming:   Normal   Cosmetic use:   Age appropriate   Posture/gait:   Tense   Motor activity:   Restless   Sensorium  Attention:   Vigilant   Concentration:   Focuses on irrelevancies; Preoccupied   Orientation:   Person   Recall/memory:   Defective in Immediate; Defective in Short-term   Affect and Mood  Affect:   Labile   Mood:   Hypomania; Irritable   Relating  Eye contact:   Fleeting   Facial expression:   Constricted; Tense   Attitude toward examiner:   Defensive; Irritable   Thought and Language  Speech flow:  Flight of Ideas; Pressured   Thought content:   Ideas of Influence; Delusions   Preoccupation:   None   Hallucinations:   Auditory   Organization:   Disorganized; Environmental manager of Knowledge:   Average   Intelligence:   Average   Abstraction:   Functional   Judgement:   Impaired   Reality Testing:   Distorted   Insight:   Lacking; Gaps   Decision Making:   Confused; Vacilates   Social Functioning  Social Maturity:   Responsible   Social Judgement:   Normal   Stress  Stressors:   Work; Illness; Grief/losses   Coping Ability:   Exhausted   Skill Deficits:   Communication; Interpersonal; Self-control   Supports:   Family     Religion: Religion/Spirituality Are You A Religious Person?:  No How Might This Affect Treatment?: N/A  Leisure/Recreation: Leisure / Recreation Do You Have Hobbies?: No  Exercise/Diet: Exercise/Diet Do You Exercise?: No Have You Gained or Lost A Significant Amount of Weight in the Past Six Months?: No Do You Follow a Special Diet?: No Do You Have Any Trouble Sleeping?: Yes Explanation of Sleeping Difficulties: Per husband, patient has not slept in 5 days   CCA Employment/Education Employment/Work Situation: Employment / Work Situation Employment Situation: Employed Work Stressors: Patient works in a call center for [employer] - describes it as "very stressful," however does not elaborate. Patient's Job has Been Impacted by Current Illness: Yes Describe how Patient's Job has Been Impacted: Patient is  currently on leave due to pain and stress related issues. Has Patient ever Been in the [employer]?: No  Education: Education Is Patient Currently Attending School?: No Last Grade Completed: 12 Did You Attend College?: No Did You Have An Individualized Education Program (IIEP): No Did You Have Any Difficulty At School?: No Patient's Education Has Been Impacted by Current Illness: No   CCA Family/Childhood History Family and Relationship History: Family history Marital status: Married Number of Years Married:  (NA) What types of issues is patient dealing with in the relationship?: No concerns reported Additional relationship information: NA Does patient have children?: Yes How many children?: 2 How is patient's relationship with their children?: 16 y.o.daughter and 20 y.o. son - no concerns reported  Childhood History:  Childhood History By whom was/is the patient raised?: Both parents Did patient suffer any verbal/emotional/physical/sexual abuse as a child?: No Did patient suffer from severe childhood neglect?: No Has patient ever been sexually abused/assaulted/raped as an adolescent or adult?: No Was the patient ever a victim of a  crime or a disaster?: No Witnessed domestic violence?: No Has patient been affected by domestic violence as an adult?: No       CCA Substance Use Alcohol/Drug Use: Alcohol / Drug Use Pain Medications: See MAR Prescriptions: See MAR Over the Counter: See MAR History of alcohol / drug use?: Yes Longest period of sobriety (when/how long): N/A Withdrawal Symptoms: None Substance #1 Name of Substance 1: THC 1 - Age of First Use: unknown 1 - Amount (size/oz): 2 blunts 1 - Frequency: daily 1 - Duration: "years" 1 - Last Use / Amount: yesterday - 2 blunts 1 - Method of Aquiring: NA 1- Route of Use: smokes                       ASAM's:  Six Dimensions of Multidimensional Assessment  Dimension 1:  Acute Intoxication and/or Withdrawal Potential:   Dimension 1:  Description of individual's past and current experiences of substance use and withdrawal: N/A  Dimension 2:  Biomedical Conditions and Complications:   Dimension 2:  Description of patient's biomedical conditions and  complications: Pain issues related to osteoarthritis.  Dimension 3:  Emotional, Behavioral, or Cognitive Conditions and Complications:  Dimension 3:  Description of emotional, behavioral, or cognitive conditions and complications: Unclear mental health hx : pt has hx of similar episodes, however no psych dx in chart  Dimension 4:  Readiness to Change:  Dimension 4:  Description of Readiness to Change criteria: No insight into THC being a psychoactive substance  Dimension 5:  Relapse, Continued use, or Continued Problem Potential:  Dimension 5:  Relapse, continued use, or continued problem potential critiera description: limited understanding of MI and SA related issues  Dimension 6:  Recovery/Living Environment:  Dimension 6:  Recovery/Iiving environment criteria description: Husband supportive of patient, however he is not concerned about patient's THC use.  ASAM Severity Score: ASAM's Severity Rating Score:  11  ASAM Recommended Level of Treatment: ASAM Recommended Level of Treatment: Level I Outpatient Treatment   Substance use Disorder (SUD) Substance Use Disorder (SUD)  Checklist Symptoms of Substance Use: Continued use despite having a persistent/recurrent physical/psychological problem caused/exacerbated by use, Persistent desire or unsuccessful efforts to cut down or control use, Presence of craving or strong urge to use  Recommendations for Services/Supports/Treatments: Recommendations for Services/Supports/Treatments Recommendations For Services/Supports/Treatments: Other (Comment) (ED - med clearance with observation period)  Discharge Disposition:    DSM5 Diagnoses: Patient Active Problem  List   Diagnosis Date Noted   Primary osteoarthritis of left hip 03/03/2018   Primary osteoarthritis of right hip 11/11/2017   Osteoarthritis of left hip 11/08/2017     Referrals to Alternative Service(s): Referred to Alternative Service(s):   Place:   Date:   Time:    Referred to Alternative Service(s):   Place:   Date:   Time:    Referred to Alternative Service(s):   Place:   Date:   Time:    Referred to Alternative Service(s):   Place:   Date:   Time:     Yetta Glassman, [facility]

## 2022-10-03 NOTE — ED Notes (Signed)
Patient verbalizes understanding of discharge instructions. Opportunity for questioning and answers were provided. Armband removed by staff, pt discharged from ED. Pt ambulatory to ED waiting room with steady gait.  

## 2022-10-03 NOTE — ED Notes (Signed)
Patient is being transferred to [facility] for medical clearance. Safe transport has been called for transport.

## 2022-10-03 NOTE — ED Notes (Signed)
Pt requesting to leave. PA to bedside to speak to pt.

## 2022-10-03 NOTE — Discharge Instructions (Signed)
There were no major issues on your lab workup or EKG today, other than a fast heart rate as we discussed.  Please follow-up with your doctor in the next 48 hours for recheck.  Go back to behavioral health urgent care or return to the emergency department if your symptoms worsen or you change your mind about receiving further evaluation.

## 2022-10-03 NOTE — ED Triage Notes (Signed)
Pt to ED POV from [facility] for medical clearance. Pt refusing to say why she was initially brought into [facility]. Pt states she has right arm pain and has not been able to sleep much lately. Pt was also recently dx with a pinched nerve. Pt has hx of anxiety and depression. Pt refusing to answer questions in triage. Pt anxious and pacing in triage. Pt's friend at bedside states he brought her to [facility] for a "nervous breakdown." Pt denies SI / HI in triage.

## 2022-10-03 NOTE — BH Assessment (Deleted)
Comprehensive Clinical Assessment (CCA) Note  10/03/2022 Gurjot Thielemann [id number]  Disposition: Per Roselyn Bering, NP ED medical clearance is recommended due to recent onset of AMS.   The patient demonstrates the following risk factors for suicide: Chronic risk factors for suicide include: psychiatric disorder of Psychosis Unspecified. Acute risk factors for suicide include: social withdrawal/isolation. Protective factors for this patient include: positive social support, positive therapeutic relationship, responsibility to others (children, family), and hope for the future. Considering these factors, the overall suicide risk at this point appears to be low. Patient is appropriate for outpatient follow up, once stabilized.   Patient is a 42 year old female who presents voluntarily, accompanied by her husband, to [facility] for assessment.   Patient is quite disorganized on arrival.  She is observed rocking back and forth throughout the assessment, sticking her tongue in and out of her mouth at points.  Patient provides minimal responses and appears agitated, as evidenced by long and loud sighs with clinched fists.  Several times, patient stands abruptly while making statements about God sending her here. Patient struggles to provide meaningful responses during the assessment.  She displays poor insight into her current symptoms and presentation.  She denies SI and HI.  She admitted to hearing voices telling her "to do things."  Patient preferred that her husband stay for the assessment and she allowed him to share concerns.     Patient's husband reports that "something shifted" yesterday while they were watching a movie.  He noticed she stopped responding to him and appeared to be in a "daze."  She then began to have loud conversations with people who weren't there.  He reports she was screaming, yelling and banging on the wall.  Patient's husband reports patient has not slept in  the past 5 days.  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 In addition to feeling stress in her current role, patient has also been in a lot of pain with a pinched nerve in her arm and is currently out of work due to the stress from work and the pain.  She is scheduled to have an injection for the pain soon.  He reports patient lost her mother, on their son's birthday 3 years ago.  He believes the upcoming anniversary could be contributing to her current symptoms.  He reports that prior to yesterday, patient was functioning fairly normally.  Patient's husband reports patient's PCP manages her medications.  He is aware that patient is out of her Gabapentin, however he is uncertain as to how many days she has been off of it.  She has continued Tizanidine as prescribed.  Patient's husband shares that patient has had two prior similar episodes in 2009 and 2012.  She was admitted for inpatient psychiatric treatment following these episodes.  Patient's husband reports patient has not followed up with outpatient psychiatric treatment following these admissions, stating "she was fine and she did okay for years" following the hospitalizations.    Chief Complaint:  Chief Complaint  Patient presents with   Manic Behavior   Visit Diagnosis: Psychosis Unspecified  CCA Screening, Triage and Referral (STR)  Patient Reported Information How did you hear about [place]? No data recorded What Is the Reason for Your Visit/Call Today? Pt presents to [facility] voluntarily accompanied by her husband. Pt is presenting with worsening uncontrolled anxiety and expressing manic behavior . Pts husband reports that her  behavior has been happening within the past 24 hours. Pts husband reports that her last outburst (similar to this one) has been years ago. Pt appears to be sticking her tounge in and out every few minutes and continuing to "rock herself back  and forth". Pt states, "I was told that I have been screaming and yelling." Pt reports that she is fine, but is not according to her husband. Pts husband reports that she is not remembering what she is saying and that she has had increased anxiety. Pt is agitated, manic and pressured speech. Pt reports to be on medication for pain and smoked weed 24 hrs ago. Pt reports she does hear voices when she sleeps at night telling her "to do things". Pt denies alcohol use, SI, HI and VH.  How Long Has This Been Causing You Problems? <Week  What Do You Feel Would Help You the Most Today? Stress Management; Treatment for Depression or other mood problem   Have You Recently Had Any Thoughts About Hurting Yourself? No  Are You Planning to Commit Suicide/Harm Yourself At This time? No   Flowsheet Row ED from 10/03/2022 in [facility]  C-SSRS RISK CATEGORY No Risk       Have you Recently Had Thoughts About Hurting Someone Karolee Ohs? No  Are You Planning to Harm Someone at This Time? No  Explanation: No data recorded  Have You Used Any Alcohol or Drugs in the Past 24 Hours? Yes  What Did You Use and How Much? one blunt   Do You Currently Have a Therapist/Psychiatrist? No  Name of Therapist/Psychiatrist: Name of Therapist/Psychiatrist: N/A   Have You Been Recently Discharged From Any Office Practice or Programs? No  Explanation of Discharge From Practice/Program: N/A     CCA Screening Triage Referral Assessment Type of Contact: Face-to-Face  Telemedicine Service Delivery:   Is this Initial or Reassessment?   Date Telepsych consult ordered in [facility]:    Time Telepsych consult ordered in [facility]:    Location of Assessment: [facility]  Provider Location: [facility] Assessment Services   Collateral Involvement: Husband is present and he provided collateral.   Does Patient Have a Automotive engineer Guardian? No  Legal Guardian Contact Information:  N/A  Copy of Legal Guardianship Form: -- (N/A)  Legal Guardian Notified of Arrival: -- (N/A)  Legal Guardian Notified of Pending Discharge: -- (N/A)  If Minor and Not Living with Parent(s), Who has Custody? N/A  Is CPS involved or ever been involved? Never  Is APS involved or ever been involved? Never   Patient Determined To Be At Risk for Harm To Self or Others Based on Review of Patient Reported Information or Presenting Complaint? No  Method: -- (N/A, No HI)  Availability of Means: -- (N/A, No HI)  Intent: -- (N/A, No HI)  Notification Required: -- (N/A, No HI)  Additional Information for Danger to Others Potential: -- (N/A, No HI)  Additional Comments for Danger to Others Potential: N/A, No HI  Are There Guns or Other Weapons in Your Home? No  Types of Guns/Weapons: N/A  Are These Weapons Safely Secured?                            -- (N/A)  Who Could Verify You Are Able To Have These Secured: N/A  Do You Have any Outstanding Charges, Pending Court Dates, Parole/Probation? None  Contacted To Inform of Risk  of Harm To Self or Others: Family/Significant Other:    Does Patient Present under Involuntary Commitment? No    [place] of Residence: [place]   Patient Currently Receiving the Following Services: Not Receiving Services   Determination of Need: Urgent (48 hours)   Options For Referral: Medication Management; Inpatient Hospitalization; BH Urgent Care     CCA Biopsychosocial Patient Reported Schizophrenia/Schizoaffective Diagnosis in Past: No   Strengths: Patient has family support, husband is present and quite supportive   Mental Health Symptoms Depression:   Difficulty Concentrating; Irritability   Duration of Depressive symptoms:  Duration of Depressive Symptoms: Greater than two weeks   Mania:   Racing thoughts; Overconfidence; Irritability; Increased Energy   Anxiety:    Worrying; Tension; Sleep; Restlessness   Psychosis:  No  data recorded  Duration of Psychotic symptoms:    Trauma:   None   Obsessions:   None   Compulsions:   None   Inattention:   N/A   Hyperactivity/Impulsivity:   N/A   Oppositional/Defiant Behaviors:   N/A   Emotional Irregularity:   Mood lability; Transient, stress-related paranoia/disassociation   Other Mood/Personality Symptoms:   Worsening psychosis, manic sx onset yesterday    Mental Status Exam Appearance and self-care  Stature:   Average   Weight:   Average weight   Clothing:   Casual   Grooming:   Normal   Cosmetic use:   Age appropriate   Posture/gait:   Tense   Motor activity:   Restless   Sensorium  Attention:   Vigilant   Concentration:   Focuses on irrelevancies; Preoccupied   Orientation:   Person   Recall/memory:   Defective in Immediate; Defective in Short-term   Affect and Mood  Affect:   Labile   Mood:   Hypomania; Irritable   Relating  Eye contact:   Fleeting   Facial expression:   Constricted; Tense   Attitude toward examiner:   Defensive; Irritable   Thought and Language  Speech flow:  Flight of Ideas; Pressured   Thought content:   Ideas of Influence; Delusions   Preoccupation:   None   Hallucinations:   Auditory   Organization:   Disorganized; Environmental manager of Knowledge:   Average   Intelligence:   Average   Abstraction:   Functional   Judgement:   Impaired   Reality Testing:   Distorted   Insight:   Lacking; Gaps   Decision Making:   Confused; Vacilates   Social Functioning  Social Maturity:   Responsible   Social Judgement:   Normal   Stress  Stressors:   Work; Illness; Grief/losses   Coping Ability:   Exhausted   Skill Deficits:   Communication; Interpersonal; Self-control   Supports:   Family     Religion: Religion/Spirituality Are You A Religious Person?: No How Might This Affect Treatment?:  N/A  Leisure/Recreation: Leisure / Recreation Do You Have Hobbies?: No  Exercise/Diet: Exercise/Diet Do You Exercise?: No Have You Gained or Lost A Significant Amount of Weight in the Past Six Months?: No Do You Follow a Special Diet?: No Do You Have Any Trouble Sleeping?: Yes Explanation of Sleeping Difficulties: Per husband, patient has not slept in 5 days   CCA Employment/Education Employment/Work Situation: Employment / Work Situation Employment Situation: Employed Work Stressors: Patient works in a call center for [employer] - describes it as "very stressful," however does not elaborate. Patient's Job has Been Impacted by Current Illness: Yes Describe  how Patient's Job has Been Impacted: Patient is currently on leave due to pain and stress related issues. Has Patient ever Been in the [employer]?: No  Education: Education Is Patient Currently Attending School?: No Last Grade Completed: 12 Did You Attend College?: No Did You Have An Individualized Education Program (IIEP): No Did You Have Any Difficulty At School?: No Patient's Education Has Been Impacted by Current Illness: No   CCA Family/Childhood History Family and Relationship History: Family history Marital status: Married Number of Years Married:  (NA) What types of issues is patient dealing with in the relationship?: No concerns reported Additional relationship information: NA Does patient have children?: Yes How many children?: 2 How is patient's relationship with their children?: 16 y.o.daughter and 2 y.o. son - no concerns reported  Childhood History:  Childhood History By whom was/is the patient raised?: Both parents Did patient suffer any verbal/emotional/physical/sexual abuse as a child?: No Did patient suffer from severe childhood neglect?: No Has patient ever been sexually abused/assaulted/raped as an adolescent or adult?: No Was the patient ever a victim of a crime or a disaster?: No Witnessed  domestic violence?: No Has patient been affected by domestic violence as an adult?: No       CCA Substance Use Alcohol/Drug Use: Alcohol / Drug Use Pain Medications: See MAR Prescriptions: See MAR Over the Counter: See MAR History of alcohol / drug use?: Yes Longest period of sobriety (when/how long): N/A Withdrawal Symptoms: None Substance #1 Name of Substance 1: THC 1 - Age of First Use: unknown 1 - Amount (size/oz): 2 blunts 1 - Frequency: daily 1 - Duration: "years" 1 - Last Use / Amount: yesterday - 2 blunts 1 - Method of Aquiring: NA 1- Route of Use: smokes                       ASAM's:  Six Dimensions of Multidimensional Assessment  Dimension 1:  Acute Intoxication and/or Withdrawal Potential:   Dimension 1:  Description of individual's past and current experiences of substance use and withdrawal: N/A  Dimension 2:  Biomedical Conditions and Complications:   Dimension 2:  Description of patient's biomedical conditions and  complications: Pain issues related to osteoarthritis.  Dimension 3:  Emotional, Behavioral, or Cognitive Conditions and Complications:  Dimension 3:  Description of emotional, behavioral, or cognitive conditions and complications: Unclear mental health hx : pt has hx of similar episodes, however no psych dx in chart  Dimension 4:  Readiness to Change:  Dimension 4:  Description of Readiness to Change criteria: No insight into THC being a psychoactive substance  Dimension 5:  Relapse, Continued use, or Continued Problem Potential:  Dimension 5:  Relapse, continued use, or continued problem potential critiera description: limited understanding of MI and SA related issues  Dimension 6:  Recovery/Living Environment:  Dimension 6:  Recovery/Iiving environment criteria description: Husband supportive of patient, however he is not concerned about patient's THC use.  ASAM Severity Score: ASAM's Severity Rating Score: 11  ASAM Recommended Level of  Treatment: ASAM Recommended Level of Treatment: Level I Outpatient Treatment   Substance use Disorder (SUD) Substance Use Disorder (SUD)  Checklist Symptoms of Substance Use: Continued use despite having a persistent/recurrent physical/psychological problem caused/exacerbated by use, Persistent desire or unsuccessful efforts to cut down or control use, Presence of craving or strong urge to use  Recommendations for Services/Supports/Treatments: Recommendations for Services/Supports/Treatments Recommendations For Services/Supports/Treatments: Other (Comment) (ED - med clearance with observation period)  Discharge Disposition:  DSM5 Diagnoses: Patient Active Problem List   Diagnosis Date Noted   Primary osteoarthritis of left hip 03/03/2018   Primary osteoarthritis of right hip 11/11/2017   Osteoarthritis of left hip 11/08/2017     Referrals to Alternative Service(s): Referred to Alternative Service(s):   Place:   Date:   Time:    Referred to Alternative Service(s):   Place:   Date:   Time:    Referred to Alternative Service(s):   Place:   Date:   Time:    Referred to Alternative Service(s):   Place:   Date:   Time:     Yetta Glassman, [facility]

## 2022-10-03 NOTE — Progress Notes (Signed)
   10/03/22 1226  [facility] Triage Screening (Walk-ins at [facility] only)  What Is the Reason for Your Visit/Call Today? Pt presents to [facility] voluntarily accompanied by her husband. Pt is presenting with worsening uncontrolled anxiety and expressing manic behavior . Pts husband reports that her behavior has been happening within the past 24 hours. Pts husband reports that her last outburst (similar to this one) has been years ago. Pt appears to be sticking her tounge in and out every few minutes and continuing to "rock herself back and forth". Pt states, "I was told that I have been screaming and yelling." Pt reports that she is fine, but is not according to her husband. Pts husband reports that she is not remembering what she is saying and that she has had increased anxiety. Pt is agitated, manic and pressured speech. Pt reports to be on medication for pain and smoked weed 24 hrs ago. Pt reports she does hear voices when she sleeps at night telling her "to do things". Pt denies alcohol use, SI, HI and VH.  How Long Has This Been Causing You Problems? <Week  Have You Recently Had Any Thoughts About Hurting Yourself? No  Are You Planning to Commit Suicide/Harm Yourself At This time? No  Have you Recently Had Thoughts About Hurting Someone Karolee Ohs? No  Are You Planning To Harm Someone At This Time? No  Are you currently experiencing any auditory, visual or other hallucinations? Yes  Please explain the hallucinations you are currently experiencing: "they tell me to do things I do not want to do"  Have You Used Any Alcohol or Drugs in the Past 24 Hours? Yes  How long ago did you use Drugs or Alcohol? smoked weed  What Did You Use and How Much? one blunt  Do you have any current medical co-morbidities that require immediate attention? No  Clinician description of patient physical appearance/behavior: agitated, pressured speech, manic  What Do You Feel Would Help You the Most Today? Stress Management;Treatment for  Depression or other mood problem  If access to [facility]gent [facility] was not available, would you have sought care in the Emergency Department? No  Determination of Need Urgent (48 hours)  Options For Referral Medication Management;Inpatient Hospitalization

## 2022-10-04 ENCOUNTER — Emergency Department (HOSPITAL_COMMUNITY)
Admission: EM | Admit: 2022-10-04 | Discharge: 2022-10-05 | Disposition: A | Discharge status: Other Acute Inpatient Hospital | Payer: Medicaid Other | Attending: Emergency Medicine | Admitting: Emergency Medicine

## 2022-10-04 ENCOUNTER — Other Ambulatory Visit: Payer: Self-pay

## 2022-10-04 ENCOUNTER — Encounter (HOSPITAL_COMMUNITY): Payer: Self-pay | Admitting: Emergency Medicine

## 2022-10-04 DIAGNOSIS — R456 Violent behavior: Secondary | ICD-10-CM | POA: Diagnosis not present

## 2022-10-04 DIAGNOSIS — D72829 Elevated white blood cell count, unspecified: Secondary | ICD-10-CM | POA: Diagnosis not present

## 2022-10-04 DIAGNOSIS — R440 Auditory hallucinations: Secondary | ICD-10-CM | POA: Insufficient documentation

## 2022-10-04 DIAGNOSIS — Z7982 Long term (current) use of aspirin: Secondary | ICD-10-CM | POA: Diagnosis not present

## 2022-10-04 DIAGNOSIS — R4689 Other symptoms and signs involving appearance and behavior: Secondary | ICD-10-CM

## 2022-10-04 DIAGNOSIS — R462 Strange and inexplicable behavior: Secondary | ICD-10-CM | POA: Diagnosis not present

## 2022-10-04 DIAGNOSIS — F29 Unspecified psychosis not due to a substance or known physiological condition: Secondary | ICD-10-CM | POA: Diagnosis not present

## 2022-10-04 LAB — URINALYSIS, MICROSCOPIC (REFLEX): Bacteria, UA: NONE SEEN

## 2022-10-04 LAB — RAPID URINE DRUG SCREEN, HOSP PERFORMED
Amphetamines: NOT DETECTED
Barbiturates: NOT DETECTED
Benzodiazepines: NOT DETECTED
Cocaine: NOT DETECTED
Opiates: NOT DETECTED
Tetrahydrocannabinol: POSITIVE — AB

## 2022-10-04 LAB — URINALYSIS, ROUTINE W REFLEX MICROSCOPIC
Bilirubin Urine: NEGATIVE
Glucose, UA: NEGATIVE mg/dL
Ketones, ur: NEGATIVE mg/dL
Leukocytes,Ua: NEGATIVE
Nitrite: NEGATIVE
Protein, ur: NEGATIVE mg/dL
Specific Gravity, Urine: <1.005 — ABNORMAL LOW (ref 1.005–1.030)
pH: 6 (ref 5.0–8.0)

## 2022-10-04 MED ORDER — DIPHENHYDRAMINE HCL 25 MG PO CAPS
50.0000 mg | ORAL_CAPSULE | Freq: Four times a day (QID) | ORAL | Status: DC | PRN
  Administered 2022-10-04 – 2022-10-05 (×2): 50 mg via ORAL
  Filled 2022-10-04 (×2): qty 2

## 2022-10-04 MED ORDER — POTASSIUM CHLORIDE CRYS ER 20 MEQ PO TBCR
40.0000 meq | EXTENDED_RELEASE_TABLET | Freq: Once | ORAL | Status: AC
  Administered 2022-10-04: 40 meq via ORAL
  Filled 2022-10-04: qty 2

## 2022-10-04 MED ORDER — RISPERIDONE 1 MG PO TABS
1.0000 mg | ORAL_TABLET | Freq: Two times a day (BID) | ORAL | Status: DC
  Administered 2022-10-04 – 2022-10-05 (×3): 1 mg via ORAL
  Filled 2022-10-04 (×3): qty 1

## 2022-10-04 MED ORDER — DIPHENHYDRAMINE HCL 50 MG/ML IJ SOLN: 50.0000 mg | Freq: Four times a day (QID) | INTRAMUSCULAR | Status: DC | PRN

## 2022-10-04 MED ORDER — HALOPERIDOL LACTATE 5 MG/ML IJ SOLN: 5.0000 mg | Freq: Four times a day (QID) | INTRAMUSCULAR | Status: DC | PRN

## 2022-10-04 MED ORDER — LORAZEPAM 2 MG/ML IJ SOLN: 2.0000 mg | Freq: Four times a day (QID) | INTRAMUSCULAR | Status: DC | PRN

## 2022-10-04 MED ORDER — HYDROXYZINE HCL 50 MG PO TABS
50.0000 mg | ORAL_TABLET | Freq: Three times a day (TID) | ORAL | Status: DC | PRN
  Administered 2022-10-04: 50 mg via ORAL
  Filled 2022-10-04: qty 1

## 2022-10-04 MED ORDER — HALOPERIDOL 5 MG PO TABS
5.0000 mg | ORAL_TABLET | Freq: Four times a day (QID) | ORAL | Status: DC | PRN
  Administered 2022-10-04 – 2022-10-05 (×2): 5 mg via ORAL
  Filled 2022-10-04 (×2): qty 1

## 2022-10-04 MED ORDER — LORAZEPAM 1 MG PO TABS
2.0000 mg | ORAL_TABLET | Freq: Four times a day (QID) | ORAL | Status: DC | PRN
  Administered 2022-10-04 – 2022-10-05 (×2): 2 mg via ORAL
  Filled 2022-10-04 (×2): qty 2

## 2022-10-04 NOTE — ED Notes (Signed)
Pt requiring a lot of redirection from sitter d/t pt trying to put her fingers in sockets in room. Pt constantly touching the wall. Pt alert and oriented to person, place, time. Pt disoriented to situation and said she is here for a cold. Pt will not make eye contact with this RN when I asked her orientation questions.

## 2022-10-04 NOTE — ED Provider Notes (Signed)
[place] EMERGENCY DEPARTMENT AT [facility] Provider Note  CSN: [id number] Arrival date & time: 10/04/22 6644  Chief Complaint(s) IVC  HPI [name] is a 42 y.o. female patient returns to the emergency department under IVC taken out by the husband for erratic behavior, auditory hallucinations and aggression.  Per the IVC paperwork, "respondent is choking her self forcefully and trying to cut herself with the edge of her glasses.  She is hearing the devil telling her to get rid of her brother and she is hearing wrappers speak to her.  Also speaking in tongues tonight.  Trying to fight her husband." Patient was seen earlier in the day for concern for acute psychosis.  Patient reportedly had difficulty sleeping and saying unusual things.  Family members were concerned she was going to have a nervous breakdown.  Labs at that time were reassuring with mild leukocytosis.  No significant electrolyte derangements or renal sufficiency.  THC positive.  She was medically cleared for behavioral health evaluation.  Patient apparently left before being evaluated by behavioral health.  It is noted that the patient was coherent at that time.  Declined multiple times to stay for behavioral health evaluation.  Noted to be in sinus tachycardia.  HPI  Past Medical History Past Medical History:  Diagnosis Date   Anemia yrs ago   Anxiety yrs ago   Arthritis    oa   Depression yrs ago   Patient Active Problem List   Diagnosis Date Noted   Primary osteoarthritis of left hip 03/03/2018   Primary osteoarthritis of right hip 11/11/2017   Osteoarthritis of left hip 11/08/2017   Home Medication(s) Prior to Admission medications   Medication Sig Start Date End Date Taking? Authorizing Provider  aspirin EC 325 MG EC tablet Take 1 tablet (325 mg total) by mouth 2 (two) times daily after a meal. 03/04/18   Allena Katz, PA-C  calcium carbonate (OSCAL) 1500 (600 Ca) MG TABS tablet Take 600  mg by mouth every 3 (three) days.    [provider]  celecoxib (CELEBREX) 200 MG capsule Take 1 capsule (200 mg total) by mouth 2 (two) times daily. 03/04/18   Allena Katz, PA-C  gabapentin (NEURONTIN) 300 MG capsule Take 1 capsule (300 mg total) by mouth 3 (three) times daily. 03/04/18   Allena Katz, PA-C  oxyCODONE-acetaminophen (PERCOCET/ROXICET) 5-325 MG tablet Take 1 tablet by mouth every 4 (four) hours as needed for severe pain. 03/04/18   Allena Katz, PA-C  tiZANidine (ZANAFLEX) 2 MG tablet Take 1 tablet (2 mg total) by mouth every 6 (six) hours as needed. 03/04/18   Allena Katz, PA-C  vitamin B-12 (CYANOCOBALAMIN) 500 MCG tablet Take 500 mcg by mouth every 3 (three) days.    [provider]  vitamin E 400 UNIT capsule Take 400 Units by mouth every 3 (three) days.    [provider]  Allergies Patient has no known allergies.  Review of Systems Review of Systems As noted in HPI  Physical Exam Vital Signs  I have reviewed the triage vital signs BP (!) 155/90 (BP Location: Right Arm)   Pulse (!) 116   Temp 98.9 F (37.2 C) (Oral)   Resp 18   SpO2 100%   Physical Exam Vitals reviewed.  Constitutional:      General: She is not in acute distress.    Appearance: She is well-developed. She is not diaphoretic.  HENT:     Head: Normocephalic and atraumatic.     Nose: Nose normal.  Eyes:     General: No scleral icterus.       Right eye: No discharge.        Left eye: No discharge.     Conjunctiva/sclera: Conjunctivae normal.     Pupils: Pupils are equal, round, and reactive to light.  Cardiovascular:     Rate and Rhythm: Regular rhythm. Tachycardia present.     Heart sounds: No murmur heard.    No friction rub. No gallop.  Pulmonary:     Effort: Pulmonary effort is normal. No respiratory distress.      Breath sounds: Normal breath sounds. No stridor. No rales.  Abdominal:     General: There is no distension.     Palpations: Abdomen is soft.     Tenderness: There is no abdominal tenderness.  Musculoskeletal:        General: No tenderness.     Cervical back: Normal range of motion and neck supple.  Skin:    General: Skin is warm and dry.     Findings: No erythema or rash.  Neurological:     Mental Status: She is alert and oriented to person, place, and time.  Psychiatric:        Attention and Perception: Attention normal.        Mood and Affect: Mood is elated.        Speech: Speech normal.        Behavior: Behavior is hyperactive.        Cognition and Memory: Memory normal.     Comments: Sticks her face in her shirt. Laughing sporadically. Places her index finger on the wall while placing her face in her shirt and utters incoherent words.     ED Results and Treatments Labs (all labs ordered are listed, but only abnormal results are displayed) Labs Reviewed  URINALYSIS, ROUTINE W REFLEX MICROSCOPIC  RAPID URINE DRUG SCREEN, HOSP PERFORMED                                                                                                                         EKG  EKG Interpretation Date/Time:  Thursday October 04 2022 06:38:29 EDT Ventricular Rate:  116 PR Interval:  124 QRS Duration:  70 QT Interval:  314 QTC Calculation: 436 R Axis:   57  Text Interpretation: Sinus tachycardia Biatrial enlargement Abnormal ECG No significant  change since last tracing Confirmed by Melene Plan [phone]) on 10/04/2022 7:01:09 AM       Radiology No results found.  Medications Ordered in ED Medications - No data to display Procedures Procedures  (including critical care time) Medical Decision Making / ED Course   Medical Decision Making Amount and/or Complexity of Data Reviewed Labs: ordered. ECG/medicine tests: ordered and independent interpretation performed. Decision-making  details documented in ED Course.    Patient presented under IVC for concern for acute psychosis. Valuated yesterday and medically cleared for behavioral health evaluation but patient left prior to being evaluated. Patient is still noted to be tachycardic.  EKG is sinus tachycardia.  No dysrhythmias. During workup yesterday, patient did not get urinalysis.  Will get this to rule out infection.  Will also repeat UDS to assess for recent cocaine or amphetamine use.  If negative she is medically cleared for [facility].    Final Clinical Impression(s) / ED Diagnoses Final diagnoses:  Abnormal behavior    This chart was dictated using voice recognition software.  Despite best efforts to proofread,  errors can occur which can change the documentation meaning.    Nira Conn, MD 10/04/22 [phone]

## 2022-10-04 NOTE — Progress Notes (Incomplete)
Pt was accepted to [facility] 10/04/2022; Bed Assignment 900  Pt meets inpatient criteria per   Attending Physician will be Dr. Josem Kaufmann, MD  Report can be called to: - [phone] ext: [phone] or [phone] (Please call report at 8:00am)  Pt can arrive after 10:00am  Care Team notified:   Kelton Pillar, LCSWA 10/04/2022 @ 11:22 PM

## 2022-10-04 NOTE — Consult Note (Addendum)
BH ED ASSESSMENT   Reason for Consult:  Psych Consult Referring Physician:  Melene Plan, DO  Patient Identification: [name] MRN:  [id number] ED Chief Complaint: Psychosis [facility])  Diagnosis:  Principal Problem:   Psychosis [facility])   ED Assessment Time Calculation: Start Time: 1300 Stop Time: 1358 Total Time in Minutes (Assessment Completion): 58   Subjective:    [name] is a 42 y.o. AA female with a past psychiatric history of psychosis, and pertinent medical comorbidities that include chronic pain, cervical radiculopathy, and obesity, who presented this encounter by way of [employer] custody for concerns for psychosis.  Patient is appreciably involuntarily committed at this time but medically cleared per EDP team.  Patient notably seen at the [facility] initially for psychosis, transferred to [facility] for medical clearance, then was subsequently discharged from the [facility] emergency department after medical clearance was confirmed, due to requesting to be allowed to discharge from the emergency department.   HPI:   Patient seen today at the [facility] emergency department for face-to-face psychiatric evaluation.  Upon evaluation, patient presents with atypical and oddly related interpersonal style, significant thought blocking, responding to internal stimuli, and short, vague, and concrete thought process, with animated and odd affect and eye contact.  Patient able to hardly answer any questions from this provider without being incredibly vague and blocked in her thoughts.  Patient orientation is not intact, but no concerns for fluctuations in consciousness.  Patient in between attempting to answer questions appreciably extended her arms out frequently to attempt to touch things that did not appear to be present, as well as shifting her eyes around the room appearing to be seeing things that are not there, as well as having a mild degree of internal restlessness.  Patient patient  endorsed her mood as "I am fine".  Patient endorsed she has not been sleeping, but could not expand on this much further.  Patient endorsed that she had no suicidal homicidal ideations, then states to this Clinical research associate that she has been fighting with her husband and is upset at him.  Patient endorses no EtOH use, but endorses daily cannabis use, as well as daily tobacco use in the form of cigarettes.  Collateral, husband, Zehra Rajput, [phone]  Call placed and collateral obtained from the patient's husband.  Patient's husband reports that he feels that the patient's presentation started about 4-5 days ago due to the patient hardly sleeping but 2 hours each day over the last several days.  Patient's husband reports that he feels that this could be related to the patient's chronic pain, states that her chronic pain has been uncontrolled due to not having her gabapentin lately and is due for her special pharmacological chronic pain injection she gets on a regular basis, states further that he feels that this has caused her to not sleep and to severely decompensate into how she is currently presenting.   Patient's husband affirms she has been having loud conversations with people who are not visible i.e. responding to internal stimuli.  He affirms that the patient has been having random moments of screaming, banging on walls, and yelling very strangely and bizarrely.  Patient's husband reports that she is on leave from her job due to the patient's current presentation and not sleeping. Husband reports that the patient does have some upcoming traumatic anniversaries due to the loss of family members, but states that he does not feel that this is the cause of the patient's severe decompensation at this  moment, reiterates he feels that this current presentation is due to severe lack of sleep, due to uncontrolled pain. Patient's husband reports that she does not have any formal mental health diagnoses, states that  she has only been to the hospital for similar presentation like this once in the past around 2007, states however notably that she "cleared up" pretty quickly after this and was not further hospitalized. Patient's husband reports that she very briefly followed up with Dr. Sheela Stack office a couple times many years ago for "to speak to someone", but denies ever being on any psychiatric medications or having any formal diagnoses given.  Patient's husband reports daily cannabis use and tobacco use, but denies EtOH use, denies any other drug use. Denies ever attempting suicide, harming others, or self injures behavior.  Past Psychiatric History: Unspecified psychosis, manic behavior, altered mental status  Risk to Self or Others: Is the patient at risk to self? Yes Has the patient been a risk to self in the past 6 months? No Has the patient been a risk to self within the distant past? Yes Is the patient a risk to others? No Has the patient been a risk to others in the past 6 months? No Has the patient been a risk to others within the distant past? No  [place] Scale:  Flowsheet Row ED from 10/04/2022 in [facility] Emergency Department at [facility] Most recent reading at 10/04/2022  3:00 AM ED from 10/03/2022 in [facility] Emergency Department at [facility] Most recent reading at 10/03/2022  3:04 PM ED from 10/03/2022 in [facility] Most recent reading at 10/03/2022 12:53 PM  C-SSRS RISK CATEGORY No Risk No Risk No Risk       Substance Abuse: Cannabis daily  Past Medical History:  Past Medical History:  Diagnosis Date   Anemia yrs ago   Anxiety yrs ago   Arthritis    oa   Depression yrs ago    Past Surgical History:  Procedure Laterality Date   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 No pertinent family history. Family Psychiatric  History: None endorsed Social History:  Social History   Substance and Sexual Activity  Alcohol Use Not Currently     Social History   Substance and Sexual Activity  Drug Use Not Currently   Comment: just marijuana when younger    Social History   Socioeconomic History   Marital status: Married    Spouse name: Not on file   Number of children: Not on file   Years of education: Not on file   Highest education level: Not on file  Occupational History   Not on file  Tobacco Use   Smoking status: Every Day    Current packs/day: 0.25    Average packs/day: 0.3 packs/day for 24.0 years (6.0 ttl pk-yrs)    Types: Cigarettes   Smokeless tobacco: Never  Vaping Use   Vaping status: Never Used  Substance and Sexual Activity   Alcohol use: Not Currently   Drug use: Not Currently    Comment: just marijuana when younger  Sexual activity: Not on file  Other Topics Concern   Not on file  Social History Narrative   Not on file   Social Determinants of Health   Financial Resource Strain: Low Risk  (04/20/2022)   Received from [facility]   Overall Financial Resource Strain (CARDIA)    Difficulty of Paying Living Expenses: Not hard at all  Food Insecurity: No Food Insecurity (04/20/2022)   [facility]   Hunger Vital Sign    Worried About Running Out of Food in the Last Year: Never true    Ran Out of Food in the Last Year: Never true  Transportation Needs: No Transportation Needs (04/20/2022)   Received from [facility] - Transportation    Lack of Transportation (Medical): No    Lack of Transportation (Non-Medical): No  Physical Activity: Not on file  Stress: Not on file   Social Connections: Unknown (06/16/2021)   Received from [facility]   Social Network    Social Network: Not on file   Additional Social History:    Allergies:  No Known Allergies  Labs:  Results for orders placed or performed during the hospital encounter of 10/04/22 (from the past 48 hour(s))  Urinalysis, Routine w reflex microscopic -Urine, Clean Catch     Status: Abnormal   Collection Time: 10/04/22  6:18 AM  Result Value Ref Range   Color, Urine YELLOW YELLOW   APPearance CLEAR CLEAR   Specific Gravity, Urine <1.005 (L) 1.005 - 1.030   pH 6.0 5.0 - 8.0   Glucose, UA NEGATIVE NEGATIVE mg/dL   Hgb urine dipstick TRACE (A) NEGATIVE   Bilirubin Urine NEGATIVE NEGATIVE   Ketones, ur NEGATIVE NEGATIVE mg/dL   Protein, ur NEGATIVE NEGATIVE mg/dL   Nitrite NEGATIVE NEGATIVE   Leukocytes,Ua NEGATIVE NEGATIVE    Comment: Performed at [facility], [address], [place], [place] [address]  Rapid urine drug screen (hospital performed)     Status: Abnormal   Collection Time: 10/04/22  6:18 AM  Result Value Ref Range   Opiates NONE DETECTED NONE DETECTED   Cocaine NONE DETECTED NONE DETECTED   Benzodiazepines NONE DETECTED NONE DETECTED   Amphetamines NONE DETECTED NONE DETECTED   Tetrahydrocannabinol POSITIVE (A) NONE DETECTED   Barbiturates NONE DETECTED NONE DETECTED    Comment: (NOTE) DRUG SCREEN FOR MEDICAL PURPOSES ONLY.  IF CONFIRMATION IS NEEDED FOR ANY PURPOSE, NOTIFY LAB WITHIN 5 DAYS.  LOWEST DETECTABLE LIMITS FOR URINE DRUG SCREEN Drug Class                     Cutoff (ng/mL) Amphetamine and metabolites    1000 Barbiturate and metabolites    200 Benzodiazepine                 200 Opiates and metabolites        300 Cocaine and metabolites        300 THC                            50 Performed at [facility], [address], [place], [place] [address]   Urinalysis, Microscopic (reflex)     Status: None   Collection Time: 10/04/22  6:18 AM   Result Value Ref Range   RBC / HPF 0-5 0 - 5 RBC/hpf   WBC, UA 0-5 0 - 5 WBC/hpf   Bacteria, UA NONE SEEN NONE  SEEN   Squamous Epithelial / HPF 0-5 0 - 5 /HPF    Comment: Performed at [facility], [address], [place], [place] [address]    Current Facility-Administered Medications  Medication Dose Route Frequency Provider Last Rate Last Admin   hydrOXYzine (ATARAX) tablet 50 mg  50 mg Oral TID PRN Lenox Ponds, NP   50 mg at 10/04/22 1350   Current Outpatient Medications  Medication Sig Dispense Refill   aspirin EC 325 MG EC tablet Take 1 tablet (325 mg total) by mouth 2 (two) times daily after a meal. 30 tablet 0   calcium carbonate (OSCAL) 1500 (600 Ca) MG TABS tablet Take 600 mg by mouth every 3 (three) days.     celecoxib (CELEBREX) 200 MG capsule Take 1 capsule (200 mg total) by mouth 2 (two) times daily. 60 capsule 1   gabapentin (NEURONTIN) 300 MG capsule Take 1 capsule (300 mg total) by mouth 3 (three) times daily. 90 capsule 0   oxyCODONE-acetaminophen (PERCOCET/ROXICET) 5-325 MG tablet Take 1 tablet by mouth every 4 (four) hours as needed for severe pain. 30 tablet 0   tiZANidine (ZANAFLEX) 2 MG tablet Take 1 tablet (2 mg total) by mouth every 6 (six) hours as needed. 60 tablet 0   vitamin B-12 (CYANOCOBALAMIN) 500 MCG tablet Take 500 mcg by mouth every 3 (three) days.     vitamin E 400 UNIT capsule Take 400 Units by mouth every 3 (three) days.      Musculoskeletal: Strength & Muscle Tone: within normal limits Gait & Station: normal Patient leans: N/A   Psychiatric Specialty Exam: Presentation  General Appearance:  Other (comment) (Atypical interpersonal style)  Eye Contact: Other (comment); Fleeting Surveyor, minerals)  Speech: Other (comment) (Halted rhythm)  Speech Volume: Normal  Handedness: Right   Mood and Affect  Mood: -- ("I'm fine")  Affect: Other (comment) (Oddly animated)   Thought Process  Thought Processes: Other (comment)  (Blocked, vague, concrete)  Descriptions of Associations:Loose  Orientation:Other (comment) (Person)  Thought Content:Scattered  History of Schizophrenia/Schizoaffective disorder:No  Duration of Psychotic Symptoms:No data recorded Hallucinations:Hallucinations: None; Other (comment) (Observed responding to internal stimuli)  Ideas of Reference:None  Suicidal Thoughts:Suicidal Thoughts: No  Homicidal Thoughts:Homicidal Thoughts: No   Sensorium  Memory: Immediate Poor; Recent Poor; Remote Poor  Judgment: Impaired  Insight: Poor; Lacking   Executive Functions  Concentration: Poor  Attention Span: Poor  Recall: Poor  Fund of Knowledge: Poor  Language: Good   Psychomotor Activity  Psychomotor Activity: Psychomotor Activity: Restlessness   Assets  Assets: Physical Health; Resilience; Social Support    Sleep  Sleep: Sleep: Poor Number of Hours of Sleep: 0   Physical Exam: Physical Exam Vitals and nursing note reviewed. Exam conducted with a chaperone present.  Constitutional:      General: She is not in acute distress.    Appearance: She is obese. She is not ill-appearing, toxic-appearing or diaphoretic.  Pulmonary:     Effort: Pulmonary effort is normal.  Skin:    General: Skin is warm and dry.  Neurological:     Mental Status: She is disoriented.  Psychiatric:        Attention and Perception: She perceives auditory (Appears to be responding to abnormal sounds) and visual (Appears to be visual responding to stimuli) hallucinations.        Mood and Affect: Mood normal. Affect is inappropriate.        Speech: Speech is delayed.  Thought Content: Thought content is not delusional. Thought content does not include homicidal or suicidal ideation.        Cognition and Memory: Cognition is impaired. Memory is impaired.        Judgment: Judgment is impulsive and inappropriate.    Review of Systems  Unable to perform ROS: Psychiatric  disorder  Psychiatric/Behavioral:  Positive for substance abuse (Cannabis).    Blood pressure (!) 146/91, pulse (!) 107, temperature 100 F (37.8 C), temperature source Oral, resp. rate 18, SpO2 100%. There is no height or weight on file to calculate BMI.  Medical Decision Making:  Patient presented this encounter by way of [employer] custody for concerns for psychosis.  Upon evaluation, patient presents severely decompensated into unspecified psychosis, warranting the recommendation for inpatient hospitalization for psychiatric care at this time.  UDS positive for cannabis, however, low clinical suspicion for substance-induced psychosis versus primary psychotic presentation.  Additionally, husband feels that unmanaged chronic pain could be because of the patient's current psychotic presentation, to which this is very unlikely, and there is low clinical suspicion for this. CSW team in the process of faxing patient out for disposition to be obtained, as well as [facility] is under review process.  Provider team will continue to follow the patient until disposition is obtained.  Recommendations  -Recommend inpatient hospitalization for mental health -Recommend agitation protocols as needed -Recommend continue involuntary commitment -Recommend start risperidone 1 mg po twice daily  Disposition: Recommend psychiatric Inpatient admission when medically cleared.  Lenox Ponds, NP 10/04/2022 2:00 PM

## 2022-10-04 NOTE — Progress Notes (Addendum)
Pt was accepted to [facility] TODAY 10/05/2022; Bed Assignment 900  Pt meets inpatient criteria per Shearon Stalls  Attending Physician will be Dr. Josem Kaufmann, MD  Report can be called to: - [phone] ext: [phone] or [phone] (Please call report at 8:00am)  Pt can arrive after 10:00am  Care Team notified:  Wyline Mood, LCSW   [place], LCSWA 10/04/2022 @ 11:22 PM

## 2022-10-04 NOTE — ED Provider Notes (Signed)
Received patient in turnover from Dr. Eudelia Bunch.  Please see their note for further details of Hx, PE.  Briefly patient is a 42 y.o. female with a IVC .  Patient with acute psychosis.  Seen yesterday by behavioral health urgent care.  Then was seen in the emergency department medically cleared and then she is left prior to being seen by psychiatry.  She was then involuntarily committed by her family and now is here.  Plan for TTS evaluation.  Patient medically clear.     Melene Plan, DO 10/04/22 [phone]

## 2022-10-04 NOTE — ED Notes (Signed)
Pt removed armband and will not put it back on. Armband placed on pt chart. Pt w/ repetitive trips to the bathroom.

## 2022-10-04 NOTE — ED Notes (Signed)
Pt continues to pace and touch the walls and electrical sockets in the room and try to remove the tap covering the sockets. Pt responding to internal stimuli, visual and auditory.

## 2022-10-04 NOTE — ED Notes (Signed)
IVC paperwork completed. Exp: 10-11-22  1 copy in medical records, original in red folder, and 3 copies attached to the clipboard in orange zone

## 2022-10-04 NOTE — ED Notes (Signed)
Patient states she has SI thoughts and a plan. But then changed her mind multiple times. She laughed to her self multiple times. She was able to tell me her name, date of birth, and where she was. She states she is hearing voices. When asking if she is seeing them too she just laughed. Her eyes stayed closed the whole time and she laughed inappropriately multiple times.

## 2022-10-04 NOTE — ED Notes (Signed)
Pt obsessed w/ touching the wall, electrical sockets, mouse for the computer, etc. Tape placed over sockets for pt safety

## 2022-10-04 NOTE — ED Notes (Signed)
Patient calm and pleasant;Patient states she just feels like she needs to get some rest; RN advised patient she will be able to rest while here; Pt given snack bag and drink at her request-Monique,RN

## 2022-10-04 NOTE — ED Notes (Signed)
Gave pt the newspaper to see if that will help pt settle down and be less restless.

## 2022-10-04 NOTE — ED Notes (Signed)
Family at bedside. 

## 2022-10-04 NOTE — ED Notes (Signed)
Pt w/ increased agitation, increased response to internal stimuli, very minimally redirectable, grabbing sitter's water bottle twice despite redirection. Pt w/ increased pacing and taking tape off the electrical sockets and touching them. Pt to be placed in bilateral wrist soft non-violent restraints until pt able to be more redirectable and/or medication effective for pt safety.

## 2022-10-04 NOTE — ED Notes (Signed)
Patient's son called for update on patient; RN attempted to speak with patient to get permission to speak with Son; pt was sleeping and would not wake; Breathing is WNL; Son advised that patient is currently sleeping-Monique,RN

## 2022-10-04 NOTE — ED Triage Notes (Signed)
Pt seen earlier today and discharged home. Pt reportedly continued to have erratic behavior and IVC paperwork was taken out by family.  Pt arrives in [employer] custody.

## 2022-10-04 NOTE — ED Notes (Signed)
Pt w/ increasing agitation and trying to pull code blue alarm. Pt having to be redirected a lot more than earlier. Pt w/ increase of responding to internal stimuli (visual and auditory hallucinations). Notified Linda Meuse NP.

## 2022-10-04 NOTE — ED Notes (Signed)
Pt laying in bed, no distress. Talked to provider

## 2022-10-04 NOTE — ED Notes (Signed)
Patient locked her self in he bathroom security called to open the door. Patient safe.

## 2022-10-04 NOTE — ED Notes (Signed)
IVC PAPERWORK EXP. 10/11/22

## 2022-10-04 NOTE — Progress Notes (Signed)
LCSW Progress Note  [id number]   Cachet Pilson  10/04/2022  10:13 PM    Inpatient Behavioral Health Placement  Pt meets inpatient criteria per Shearon Stalls. There are no available beds within [facility] per [facility] AC Kelly Southard,RN. Referral was sent to the following facilities;   Destination  Service Provider Address Phone [facility]  [address] [place], [place] [address] [phone]  [facility]  [address] [place] [address] [phone]  [facility] Patient Placement  [facility], [place] [phone]  [facility]  [address] [place] [address] [phone]  [facility]  [address] [place], [place] [address] [phone]  [facility]  [address] [place], [place] [address] [phone]  [facility]  [address] [place] [address] [phone]  [facility]  [address] [place] [address] [phone]  [facility]  [address], [place] [address] [phone]  [facility] [place]  [address] [place] [address] [phone]  [facility]  [address], [place] [address] [phone]  [facility]  [address] [place] [address] [phone]  [facility]  [address] [place] [address] [phone]  [facility]  [place] [phone]  [facility]  [address] [place] [address] [phone]  [facility] EFAX  [address] [place]s [place] [phone]   [facility]  [address], [place] [address] [phone]  [facility]  [address], [place] [address] [phone]  [facility]  [address] [place] [address] [phone]  [facility]  [address], [place] [address] [phone]  [facility]  [address] [place], [place] [address] [phone]  [facility]  [address], [place] [address] [phone]  [facility]  [address], [place] [address] [phone]  [facility] Inpatient [facility]  [place] [phone]  [facility]  [address] [place] [address] [phone]  [facility]  [address] [place] [address] [phone]  [facility] [place] [address] [phone]  [facility]  [facility] [place] [address] [phone]  [facility]  [address] [place] [address] [phone]  [facility]  [address], [place] [address] [phone]  [facility]  [address] [place] [address] [phone]    Situation ongoing,  CSW will follow up.    Maryjean Ka, MSW, [facility] 10/04/2022 10:13 PM

## 2022-10-04 NOTE — ED Notes (Signed)
Pt got out of the restraints, ambulated to the bathroom unbeknownst to this RN until pt was in the bathroom. Pt was redirectable back to bed. Pt agreeable to lay in bed and try to rest at this time.

## 2022-10-05 NOTE — ED Notes (Signed)
Patient has been up and down to the bathroom for a couple hours; Pt is restless; She continues to slap her hand together and hand are shaky; Patient continues to talk to self in a low voice;  RN asked if she needed something to help her relax and patient stated yes; PRN PO meds given without incident-Monique,RN

## 2022-10-05 NOTE — ED Provider Notes (Signed)
Emergency Medicine Observation Re-evaluation Note  [name] is a 42 y.o. female, seen on rounds today.  Pt initially presented to the ED for complaints of IVC Currently, the patient is sleeping.  Physical Exam  BP 127/77 (BP Location: Left Arm)   Pulse (!) 108   Temp 98.3 F (36.8 C) (Oral)   Resp 16   SpO2 100%  Physical Exam General: No acute distress Cardiac: Well-perfused Lungs: No respiratory distress noted Psych: Calm, sleeping  ED Course / MDM  EKG:EKG Interpretation Date/Time:  Thursday October 04 2022 06:38:29 EDT Ventricular Rate:  116 PR Interval:  124 QRS Duration:  70 QT Interval:  314 QTC Calculation: 436 R Axis:   57  Text Interpretation: Sinus tachycardia Biatrial enlargement Abnormal ECG No significant change since last tracing Confirmed by Melene Plan [phone]) on 10/04/2022 7:01:09 AM  I have reviewed the labs performed to date as well as medications administered while in observation.  Recent changes in the last 24 hours include restlessness overnight with successful treatment with oral medications.  Plan  Current plan is for transfer to [facility] today for further psychiatric treatment.    Durwin Glaze, MD 10/05/22 [phone]

## 2022-10-05 NOTE — ED Notes (Signed)
Pt is awake and walking to restroom. Once back in her room and sitting on bed, vitals obtained. Pt is a&ox4, warm and dry to touch. She states that since being able to sleep she feels much better and is feeling like she is alive again. Pt is calm and cooperative.

## 2023-06-27 ENCOUNTER — Encounter (HOSPITAL_COMMUNITY): Payer: Self-pay

## 2023-06-27 ENCOUNTER — Emergency Department (HOSPITAL_COMMUNITY)
Admission: EM | Admit: 2023-06-27 | Discharge: 2023-06-27 | Disposition: A | Discharge status: Home / Self Care | Payer: Self-pay | Attending: Emergency Medicine | Admitting: Emergency Medicine

## 2023-06-27 ENCOUNTER — Other Ambulatory Visit: Payer: Self-pay

## 2023-06-27 ENCOUNTER — Emergency Department (HOSPITAL_COMMUNITY): Payer: Self-pay

## 2023-06-27 DIAGNOSIS — H538 Other visual disturbances: Secondary | ICD-10-CM | POA: Insufficient documentation

## 2023-06-27 DIAGNOSIS — F17200 Nicotine dependence, unspecified, uncomplicated: Secondary | ICD-10-CM | POA: Insufficient documentation

## 2023-06-27 DIAGNOSIS — R519 Headache, unspecified: Secondary | ICD-10-CM | POA: Insufficient documentation

## 2023-06-27 DIAGNOSIS — Z7982 Long term (current) use of aspirin: Secondary | ICD-10-CM | POA: Insufficient documentation

## 2023-06-27 LAB — CBC WITH DIFFERENTIAL/PLATELET
Abs Immature Granulocytes: 0.03 10*3/uL (ref 0.00–0.07)
Basophils Absolute: 0.1 10*3/uL (ref 0.0–0.1)
Basophils Relative: 1 %
Eosinophils Absolute: 0.2 10*3/uL (ref 0.0–0.5)
Eosinophils Relative: 2 %
HCT: 39.6 % (ref 36.0–46.0)
Hemoglobin: 13.2 g/dL (ref 12.0–15.0)
Immature Granulocytes: 0 %
Lymphocytes Relative: 23 %
Lymphs Abs: 1.8 10*3/uL (ref 0.7–4.0)
MCH: 34.6 pg — ABNORMAL HIGH (ref 26.0–34.0)
MCHC: 33.3 g/dL (ref 30.0–36.0)
MCV: 103.9 fL — ABNORMAL HIGH (ref 80.0–100.0)
Monocytes Absolute: 0.5 10*3/uL (ref 0.1–1.0)
Monocytes Relative: 6 %
Neutro Abs: 5.3 10*3/uL (ref 1.7–7.7)
Neutrophils Relative %: 68 %
Platelets: 265 10*3/uL (ref 150–400)
RBC: 3.81 MIL/uL — ABNORMAL LOW (ref 3.87–5.11)
RDW: 14.4 % (ref 11.5–15.5)
WBC: 7.8 10*3/uL (ref 4.0–10.5)
nRBC: 0 % (ref 0.0–0.2)

## 2023-06-27 LAB — BASIC METABOLIC PANEL WITH GFR
Anion gap: 8 (ref 5–15)
BUN: 8 mg/dL (ref 6–20)
CO2: 25 mmol/L (ref 22–32)
Calcium: 8.8 mg/dL — ABNORMAL LOW (ref 8.9–10.3)
Chloride: 106 mmol/L (ref 98–111)
Creatinine, Ser: 0.73 mg/dL (ref 0.44–1.00)
GFR, Estimated: >60 mL/min (ref >60)
Glucose, Bld: 82 mg/dL (ref 70–99)
Potassium: 3.6 mmol/L (ref 3.5–5.1)
Sodium: 139 mmol/L (ref 135–145)

## 2023-06-27 LAB — SEDIMENTATION RATE: Sed Rate: 10 mm/h (ref 0–22)

## 2023-06-27 MED ORDER — PROCHLORPERAZINE EDISYLATE 10 MG/2ML IJ SOLN
10.0000 mg | Freq: Once | INTRAMUSCULAR | Status: AC
  Administered 2023-06-27: 10 mg via INTRAVENOUS
  Filled 2023-06-27: qty 2

## 2023-06-27 MED ORDER — KETOROLAC TROMETHAMINE 15 MG/ML IJ SOLN
15.0000 mg | Freq: Once | INTRAMUSCULAR | Status: AC
  Administered 2023-06-27: 15 mg via INTRAVENOUS
  Filled 2023-06-27: qty 1

## 2023-06-27 MED ORDER — DIPHENHYDRAMINE HCL 50 MG/ML IJ SOLN
25.0000 mg | Freq: Once | INTRAMUSCULAR | Status: AC
  Administered 2023-06-27: 25 mg via INTRAVENOUS
  Filled 2023-06-27: qty 1

## 2023-06-27 MED ORDER — SODIUM CHLORIDE 0.9 % IV BOLUS
1000.0000 mL | Freq: Once | INTRAVENOUS | Status: AC
  Administered 2023-06-27: 1000 mL via INTRAVENOUS

## 2023-06-27 NOTE — ED Provider Notes (Signed)
 [place] EMERGENCY DEPARTMENT AT [facility] Provider Note   CSN: [id number] Arrival date & time: 06/27/23  4098     History Chief Complaint  Patient presents with   Migraine    [name] is a 43 y.o. female patient with history of tobacco abuse, anxiety, depression who presents the emergency department today for evaluation of left temporal headache it has been intermittent but progressively worsening over the last week.  She states that the pain initially starts in the left temporal area and is starting to migrate over the entire frontal aspect of the head.  She endorses associated blurred vision in the left eye and redness in the eye.  She denies any jaw claudication but she does state that chewing does make headache more painful.  She endorses associated phonophobia and photophobia.  She denies fever, chills, nausea, vomiting, focal weakness, focal numbness.   Migraine       Home Medications Prior to Admission medications   Medication Sig Start Date End Date Taking? Authorizing Provider  aspirin  EC 325 MG EC tablet Take 1 tablet (325 mg total) by mouth 2 (two) times daily after a meal. Patient not taking: Reported on 10/04/2022 03/04/18   Sheron Dixons, PA-C  calcium carbonate (OSCAL) 1500 (600 Ca) MG TABS tablet Take 600 mg by mouth every 3 (three) days.    [provider]  celecoxib  (CELEBREX ) 200 MG capsule Take 1 capsule (200 mg total) by mouth 2 (two) times daily. Patient not taking: Reported on 10/04/2022 03/04/18   Sheron Dixons, PA-C  gabapentin  (NEURONTIN ) 300 MG capsule Take 1 capsule (300 mg total) by mouth 3 (three) times daily. 03/04/18   Sheron Dixons, PA-C  meloxicam (MOBIC) 15 MG tablet Take 15 mg by mouth daily. 04/20/22   [provider]  oxyCODONE -acetaminophen  (PERCOCET/ROXICET) 5-325 MG tablet Take 1 tablet by mouth every 4 (four) hours as needed for severe pain. Patient not taking: Reported on 10/04/2022 03/04/18    Sheron Dixons, PA-C  tiZANidine  (ZANAFLEX ) 2 MG tablet Take 1 tablet (2 mg total) by mouth every 6 (six) hours as needed. 03/04/18   Sheron Dixons, PA-C  vitamin B-12 (CYANOCOBALAMIN) 500 MCG tablet Take 500 mcg by mouth every 3 (three) days.    [provider]  vitamin E 400 UNIT capsule Take 400 Units by mouth every 3 (three) days.    [provider]      Allergies    Patient has no known allergies.    Review of Systems   Review of Systems  All other systems reviewed and are negative.   Physical Exam Updated Vital Signs BP (!) 154/97 (BP Location: Left Arm)   Pulse 76   Temp 98.2 F (36.8 C) (Oral)   Resp 16   SpO2 100%  Physical Exam Vitals and nursing note reviewed.  Constitutional:      Appearance: Normal appearance.  HENT:     Head: Normocephalic and atraumatic.  Eyes:     General:        Right eye: No discharge.        Left eye: No discharge.     Conjunctiva/sclera: Conjunctivae normal.     Comments: No conjunctival injection.  Pupils are equal and reactive to light.  Pulmonary:     Effort: Pulmonary effort is normal.  Skin:    General: Skin is warm and dry.     Findings: No rash.  Neurological:     General: No  focal deficit present.     Mental Status: She is alert and oriented to person, place, and time.     Comments: Cranial nerves II to XII are intact.  5/5 strength to the upper and lower extremities.  Normal sensation to the upper and lower extremities.  No dysmetria with finger-nose.  Extraocular movements are intact.  Speech is normal.  Answers all questions appropriately.  Psychiatric:        Mood and Affect: Mood normal.        Behavior: Behavior normal.     ED Results / Procedures / Treatments   Labs (all labs ordered are listed, but only abnormal results are displayed) Labs Reviewed  CBC WITH DIFFERENTIAL/PLATELET - Abnormal; Notable for the following components:      Result Value   RBC 3.81 (*)    MCV 103.9 (*)    MCH  34.6 (*)    All other components within normal limits  BASIC METABOLIC PANEL WITH GFR - Abnormal; Notable for the following components:   Calcium 8.8 (*)    All other components within normal limits  SEDIMENTATION RATE    EKG None  Radiology CT Head Wo Contrast Result Date: 06/27/2023 CLINICAL DATA:  Sudden and severe headache EXAM: CT HEAD WITHOUT CONTRAST TECHNIQUE: Contiguous axial images were obtained from the base of the skull through the vertex without intravenous contrast. RADIATION DOSE REDUCTION: This exam was performed according to the departmental dose-optimization program which includes automated exposure control, adjustment of the mA and/or kV according to patient size and/or use of iterative reconstruction technique. COMPARISON:  11/28/2005 FINDINGS: Brain: No evidence of acute infarction, hemorrhage, hydrocephalus, extra-axial collection or mass lesion/mass effect. Partially empty sella, nonspecific in isolation Vascular: No hyperdense vessel or unexpected calcification. Skull: Normal. Negative for fracture or focal lesion. Sinuses/Orbits: No acute finding. IMPRESSION: No acute finding or specific cause for headache. Electronically Signed   By: Ronnette Coke M.D.   On: 06/27/2023 09:28    Procedures Procedures    Medications Ordered in ED Medications  sodium chloride  0.9 % bolus 1,000 mL (1,000 mLs Intravenous New Bag/Given 06/27/23 0858)  ketorolac  (TORADOL ) 15 MG/ML injection 15 mg (15 mg Intravenous Given 06/27/23 0852)  diphenhydrAMINE  (BENADRYL ) injection 25 mg (25 mg Intravenous Given 06/27/23 0852)  prochlorperazine (COMPAZINE) injection 10 mg (10 mg Intravenous Given 06/27/23 0854)    ED Course/ Medical Decision Making/ A&P Clinical Course as of 06/27/23 1101  Thu Jun 27, 2023  0944 CBC with Differential(!) Negative.  [CF]  [id number] Basic metabolic panel(!) Negative.  [CF]  1039 Sedimentation rate Negative.  [CF]  1043 CT Head Wo Contrast I personally ordered  and interpreted the study.  Do not see any evidence of acute intracranial hemorrhage.  I do agree with radiologist interpretation. [CF]  1049 On repeat evaluation, patient states her headache is improving in addition to her vision after migraine cocktail.  I went over all labs and imaging with her at the bedside.  Work note provided.  I will have her follow-up with her primary care doctor for further evaluation.  Strict return precautions were discussed. [CF]    Clinical Course User Index [CF] Darletta Ehrich, PA-C   {   Click here for ABCD2, HEART and other calculators  Medical Decision Making [name] is a 43 y.o. female patient who presents to the emergency department today for further evaluation of left-sided headache.  Given the distribution and clinical story I am somewhat concerned for possible giant cell  arteritis.  Will get a sed rate.  On reevaluation as highlighted in ED course, her sed rate is normal.  CT scan was also normal.  Pain responding to migraine cocktail.  Will have her follow-up with her outpatient PCP.  Red flag symptoms were discussed at length.  Patient expressed full understanding.  Work note provided.  She is safe for discharge at this time.   Amount and/or Complexity of Data Reviewed Labs: ordered. Decision-making details documented in ED Course. Radiology: ordered. Decision-making details documented in ED Course.  Risk Prescription drug management.   Final Clinical Impression(s) / ED Diagnoses Final diagnoses:  Left-sided headache    Rx / DC Orders ED Discharge Orders     None         Olympia Bianchi 06/27/23 1101    Cheyenne Cotta, MD 06/28/23 1106

## 2023-06-27 NOTE — Discharge Instructions (Signed)
 As we discussed, all of your labs and imaging are normal today which is great news.  I would like for you to follow-up with your primary care doctor for further evaluation.  You may return to the emergency department for any worsening symptoms.

## 2023-06-27 NOTE — ED Triage Notes (Signed)
 Pt c/o migraine that started on the left side that is now going to the right side. Pt states it has been going on for the past week. Has been taking Aleve  Migraine, ibuprofen . Light and sound make it worse, hurts to chew.

## 2023-10-16 ENCOUNTER — Inpatient Hospital Stay (HOSPITAL_COMMUNITY)
Admission: AD | Admit: 2023-10-16 | Discharge: 2023-10-21 | DRG: 885 | Disposition: A | Discharge status: Home / Self Care | Source: Intra-hospital

## 2023-10-16 ENCOUNTER — Emergency Department (HOSPITAL_COMMUNITY)
Admission: EM | Admit: 2023-10-16 | Discharge: 2023-10-16 | Disposition: A | Discharge status: Other Acute Inpatient Hospital | Payer: Self-pay | Attending: Emergency Medicine | Admitting: Emergency Medicine

## 2023-10-16 ENCOUNTER — Encounter (HOSPITAL_COMMUNITY): Payer: Self-pay | Admitting: Psychiatry

## 2023-10-16 ENCOUNTER — Other Ambulatory Visit: Payer: Self-pay

## 2023-10-16 DIAGNOSIS — R350 Frequency of micturition: Secondary | ICD-10-CM | POA: Insufficient documentation

## 2023-10-16 DIAGNOSIS — Z716 Tobacco abuse counseling: Secondary | ICD-10-CM

## 2023-10-16 DIAGNOSIS — R000 Tachycardia, unspecified: Secondary | ICD-10-CM | POA: Insufficient documentation

## 2023-10-16 DIAGNOSIS — Z96643 Presence of artificial hip joint, bilateral: Secondary | ICD-10-CM | POA: Diagnosis present

## 2023-10-16 DIAGNOSIS — R300 Dysuria: Secondary | ICD-10-CM | POA: Diagnosis not present

## 2023-10-16 DIAGNOSIS — Z79899 Other long term (current) drug therapy: Secondary | ICD-10-CM | POA: Diagnosis not present

## 2023-10-16 DIAGNOSIS — E0590 Thyrotoxicosis, unspecified without thyrotoxic crisis or storm: Secondary | ICD-10-CM | POA: Diagnosis present

## 2023-10-16 DIAGNOSIS — Z7982 Long term (current) use of aspirin: Secondary | ICD-10-CM | POA: Diagnosis not present

## 2023-10-16 DIAGNOSIS — F17210 Nicotine dependence, cigarettes, uncomplicated: Secondary | ICD-10-CM | POA: Diagnosis present

## 2023-10-16 DIAGNOSIS — F29 Unspecified psychosis not due to a substance or known physiological condition: Secondary | ICD-10-CM | POA: Diagnosis present

## 2023-10-16 DIAGNOSIS — F315 Bipolar disorder, current episode depressed, severe, with psychotic features: Secondary | ICD-10-CM | POA: Diagnosis present

## 2023-10-16 DIAGNOSIS — F1290 Cannabis use, unspecified, uncomplicated: Secondary | ICD-10-CM | POA: Diagnosis present

## 2023-10-16 DIAGNOSIS — Z91148 Patient's other noncompliance with medication regimen for other reason: Secondary | ICD-10-CM | POA: Diagnosis not present

## 2023-10-16 DIAGNOSIS — F319 Bipolar disorder, unspecified: Secondary | ICD-10-CM

## 2023-10-16 DIAGNOSIS — E559 Vitamin D deficiency, unspecified: Secondary | ICD-10-CM | POA: Diagnosis present

## 2023-10-16 DIAGNOSIS — R451 Restlessness and agitation: Secondary | ICD-10-CM | POA: Diagnosis present

## 2023-10-16 DIAGNOSIS — F419 Anxiety disorder, unspecified: Secondary | ICD-10-CM | POA: Diagnosis present

## 2023-10-16 DIAGNOSIS — Z5941 Food insecurity: Secondary | ICD-10-CM | POA: Diagnosis not present

## 2023-10-16 DIAGNOSIS — Z046 Encounter for general psychiatric examination, requested by authority: Secondary | ICD-10-CM

## 2023-10-16 LAB — COMPREHENSIVE METABOLIC PANEL WITH GFR
ALT: 16 U/L (ref 0–44)
AST: 25 U/L (ref 15–41)
Albumin: 4.1 g/dL (ref 3.5–5.0)
Alkaline Phosphatase: 67 U/L (ref 38–126)
Anion gap: 14 (ref 5–15)
BUN: 9 mg/dL (ref 6–20)
CO2: 23 mmol/L (ref 22–32)
Calcium: 9.2 mg/dL (ref 8.9–10.3)
Chloride: 97 mmol/L — ABNORMAL LOW (ref 98–111)
Creatinine, Ser: 0.87 mg/dL (ref 0.44–1.00)
GFR, Estimated: >60 mL/min (ref >60)
Glucose, Bld: 115 mg/dL — ABNORMAL HIGH (ref 70–99)
Potassium: 3.7 mmol/L (ref 3.5–5.1)
Sodium: 134 mmol/L — ABNORMAL LOW (ref 135–145)
Total Bilirubin: 0.9 mg/dL (ref 0.0–1.2)
Total Protein: 7.5 g/dL (ref 6.5–8.1)

## 2023-10-16 LAB — CBC
HCT: 43.7 % (ref 36.0–46.0)
Hemoglobin: 14.5 g/dL (ref 12.0–15.0)
MCH: 34.5 pg — ABNORMAL HIGH (ref 26.0–34.0)
MCHC: 33.2 g/dL (ref 30.0–36.0)
MCV: 104 fL — ABNORMAL HIGH (ref 80.0–100.0)
Platelets: 233 K/uL (ref 150–400)
RBC: 4.2 MIL/uL (ref 3.87–5.11)
RDW: 13.9 % (ref 11.5–15.5)
WBC: 12.4 K/uL — ABNORMAL HIGH (ref 4.0–10.5)
nRBC: 0 % (ref 0.0–0.2)

## 2023-10-16 LAB — URINALYSIS, ROUTINE W REFLEX MICROSCOPIC
Bacteria, UA: NONE SEEN
Bilirubin Urine: NEGATIVE
Glucose, UA: NEGATIVE mg/dL
Hgb urine dipstick: NEGATIVE
Ketones, ur: 20 mg/dL — AB
Nitrite: NEGATIVE
Protein, ur: NEGATIVE mg/dL
Specific Gravity, Urine: 1.019 (ref 1.005–1.030)
pH: 6 (ref 5.0–8.0)

## 2023-10-16 LAB — RAPID URINE DRUG SCREEN, HOSP PERFORMED
Amphetamines: NOT DETECTED
Barbiturates: NOT DETECTED
Benzodiazepines: NOT DETECTED
Cocaine: NOT DETECTED
Opiates: NOT DETECTED
Tetrahydrocannabinol: POSITIVE — AB

## 2023-10-16 LAB — ETHANOL: Alcohol, Ethyl (B): <15 mg/dL (ref <15)

## 2023-10-16 LAB — HCG, SERUM, QUALITATIVE: Preg, Serum: NEGATIVE

## 2023-10-16 MED ORDER — TRAZODONE HCL 100 MG PO TABS
100.0000 mg | ORAL_TABLET | Freq: Every evening | ORAL | Status: DC | PRN
  Administered 2023-10-17 – 2023-10-20 (×3): 100 mg via ORAL
  Filled 2023-10-16 (×4): qty 1

## 2023-10-16 MED ORDER — DIPHENHYDRAMINE HCL 25 MG PO CAPS: 50.0000 mg | ORAL_CAPSULE | Freq: Three times a day (TID) | ORAL | Status: DC | PRN

## 2023-10-16 MED ORDER — GABAPENTIN 300 MG PO CAPS
300.0000 mg | ORAL_CAPSULE | Freq: Two times a day (BID) | ORAL | Status: DC
  Filled 2023-10-16: qty 1

## 2023-10-16 MED ORDER — HALOPERIDOL LACTATE 5 MG/ML IJ SOLN: 10.0000 mg | Freq: Three times a day (TID) | INTRAMUSCULAR | Status: DC | PRN

## 2023-10-16 MED ORDER — ZIPRASIDONE MESYLATE 20 MG IM SOLR
20.0000 mg | Freq: Once | INTRAMUSCULAR | Status: AC
  Administered 2023-10-16: 20 mg via INTRAMUSCULAR
  Filled 2023-10-16: qty 20

## 2023-10-16 MED ORDER — RISPERIDONE 1 MG PO TABS
1.0000 mg | ORAL_TABLET | Freq: Two times a day (BID) | ORAL | Status: DC
Start: 2023-10-16 — End: 2023-10-16
  Administered 2023-10-16: 1 mg via ORAL
  Filled 2023-10-16: qty 1

## 2023-10-16 MED ORDER — HALOPERIDOL 5 MG PO TABS: 5.0000 mg | ORAL_TABLET | Freq: Three times a day (TID) | ORAL | Status: DC | PRN

## 2023-10-16 MED ORDER — LORAZEPAM 2 MG/ML IJ SOLN: 2.0000 mg | Freq: Three times a day (TID) | INTRAMUSCULAR | Status: DC | PRN

## 2023-10-16 MED ORDER — MAGNESIUM HYDROXIDE 400 MG/5ML PO SUSP: 30.0000 mL | Freq: Every day | ORAL | Status: DC | PRN

## 2023-10-16 MED ORDER — NICOTINE 21 MG/24HR TD PT24
21.0000 mg | MEDICATED_PATCH | Freq: Every day | TRANSDERMAL | Status: DC
  Administered 2023-10-17 – 2023-10-21 (×5): 21 mg via TRANSDERMAL
  Filled 2023-10-16 (×5): qty 1

## 2023-10-16 MED ORDER — DIPHENHYDRAMINE HCL 50 MG/ML IJ SOLN: 50.0000 mg | Freq: Three times a day (TID) | INTRAMUSCULAR | Status: DC | PRN

## 2023-10-16 MED ORDER — HALOPERIDOL LACTATE 5 MG/ML IJ SOLN: 5.0000 mg | Freq: Three times a day (TID) | INTRAMUSCULAR | Status: DC | PRN

## 2023-10-16 MED ORDER — ASPIRIN 325 MG PO TBEC
325.0000 mg | DELAYED_RELEASE_TABLET | Freq: Every day | ORAL | Status: DC | PRN
  Administered 2023-10-16: 325 mg via ORAL
  Filled 2023-10-16: qty 1

## 2023-10-16 MED ORDER — STERILE WATER FOR INJECTION IJ SOLN
INTRAMUSCULAR | Status: AC
  Administered 2023-10-16: 10 mL
  Filled 2023-10-16: qty 10

## 2023-10-16 MED ORDER — GABAPENTIN 300 MG PO CAPS
300.0000 mg | ORAL_CAPSULE | Freq: Two times a day (BID) | ORAL | Status: DC
  Administered 2023-10-16 – 2023-10-21 (×10): 300 mg via ORAL
  Filled 2023-10-16 (×10): qty 1

## 2023-10-16 MED ORDER — ALUM & MAG HYDROXIDE-SIMETH 200-200-20 MG/5ML PO SUSP: 30.0000 mL | ORAL | Status: DC | PRN

## 2023-10-16 MED ORDER — ACETAMINOPHEN 325 MG PO TABS
650.0000 mg | ORAL_TABLET | Freq: Four times a day (QID) | ORAL | Status: DC | PRN
  Administered 2023-10-16 – 2023-10-20 (×7): 650 mg via ORAL
  Filled 2023-10-16 (×7): qty 2

## 2023-10-16 MED ORDER — RISPERIDONE 1 MG PO TABS
1.0000 mg | ORAL_TABLET | Freq: Two times a day (BID) | ORAL | Status: DC
  Administered 2023-10-16 – 2023-10-21 (×10): 1 mg via ORAL
  Filled 2023-10-16 (×10): qty 1

## 2023-10-16 NOTE — BH Assessment (Signed)
 TTS attempted to assess pt but telecart #1  did not connect. TTS was unable to assess pt. Pt will be seen by the day shift provider.

## 2023-10-16 NOTE — ED Notes (Signed)
 Pt changed into paper scrubs and wanded - cooperative in changing but when walking to bed, started kicking chairs and pushing over trash cans. Reinforced that behavior wouldn't be tolerated. No evidence of learning as pt replied fuck you.  PD/security at bedside.

## 2023-10-16 NOTE — ED Notes (Signed)
 Pt attempted to elope. Walked towards the front entrance of the hospital angrily before being stopped by security. Willingly came back and got into the stretcher.   Security at bedside.

## 2023-10-16 NOTE — ED Notes (Signed)
 Pt a little more calm and allowed us  to get vitals.

## 2023-10-16 NOTE — ED Notes (Signed)
Ivc paperwork attached to the clipboard in orange zone 

## 2023-10-16 NOTE — Tx Team (Signed)
 Initial Treatment Plan 10/16/2023 6:58 PM [name] FMW:[id number]    PATIENT STRESSORS: Financial difficulties   Marital or family conflict     PATIENT STRENGTHS: Supportive family/friends  Work skills    PATIENT IDENTIFIED PROBLEMS: Bipolar disorder                     DISCHARGE CRITERIA:  Improved stabilization in mood, thinking, and/or behavior Verbal commitment to aftercare and medication compliance  PRELIMINARY DISCHARGE PLAN: Outpatient therapy Return to previous living arrangement  PATIENT/FAMILY INVOLVEMENT: This treatment plan has been presented to and reviewed with the patient, [name].The patient has been given the opportunity to ask questions and make suggestions.  Jamae Tison, RN 10/16/2023, 6:58 PM

## 2023-10-16 NOTE — Plan of Care (Signed)
   Problem: Education: Goal: Knowledge of [place] [facility] Education information/materials will improve Outcome: Progressing Goal: Emotional status will improve Outcome: Progressing Goal: Mental status will improve Outcome: Progressing

## 2023-10-16 NOTE — ED Notes (Signed)
 Pt tearful in triage stating that she has a broken heart and that she does not want to do this.

## 2023-10-16 NOTE — Group Note (Signed)
 Date:  10/16/2023 Time:  4:25 PM  Group Topic/Focus:  Personal Choices and Values:   The focus of this group is to help patients assess and explore the importance of values in their lives, how their values affect their decisions, how they express their values and what opposes their expression. Recovery Goals:   The focus of this group is to identify appropriate goals for recovery and establish a plan to achieve them.    Participation Level:  Did Not Attend  Participation Quality:    Affect:    Cognitive:    Insight:   Engagement in Group:    Modes of Intervention:    Additional Comments:    Aaryana Betke 10/16/2023, 4:25 PM

## 2023-10-16 NOTE — Progress Notes (Signed)
 [name] is a 43 year old female admitted involuntarily by patient's family from [facility] ED.   Per IVC Prior DX bipolar disorder, unknown if med compliant reports seeing God and hearing an unknown lady speaking to her.  Is telling family members that she wants to be gone and free from the world and making statements about wanting hurting herself. HX of similar episodes in the past.  Patient presents worried asking how long will she be here. Per patient she stated that she was arguing with her husband about bills and not having enough money. Patient then stated experiencing a black out where she does not remember exactly what happens in that moment and next thing she knew [employer] arrived to her house and brought her in. Patient noted to have bruises on arms bilaterally and a swollen red/tender area on R wrist which she claims is from the handcuffs. Patient currently denies SI,HI, and A/V/H with no plan or intent. Patient provided with a meal, meds, and an ice pack for her wrist and pain. Patient remains cooperative at this time and oriented to unit.   BP (!) 101/90 (BP Location: Right Arm)   Pulse (!) 114   Temp 98.6 F (37 C) (Oral)   Resp 16   Ht 5' 3 (1.6 m)   Wt 87 kg   SpO2 100%   BMI 33.98 kg/m

## 2023-10-16 NOTE — ED Triage Notes (Signed)
 Pt coming in with [employer] with IVC paperwork. Pt agitated, manic, pressured speech. Hyper religious. Won't cooperate with vitals. Moving and pulling at the cuffs.

## 2023-10-16 NOTE — ED Provider Notes (Signed)
 [place] EMERGENCY DEPARTMENT AT [facility] Provider Note   CSN: [id number] Arrival date & time: 10/16/23  0101     Patient presents with: Psychiatric Evaluation   [name] is a 43 y.o. female.   43 yo female presents via [employer] and IVC paperwork. Pt is emotional and agitated stating that she does not know why she is here and does not deserve to be here. Pt states that nothing happened today and she wants to go home. Pt originally stated that there is nothing wrong, but after further questions states that she might have a UTI. Pt has been experiencing increased urinary frequency and dysuria. Pt denies suicidal ideation, homicidal ideation, anxiety, depression. Pt denies any pain. Pt is tachycardic with rapid speech. Pt is cooperative.  Per IVC: Patient reports seeing God and hearing an unknown lady speaking to her.  She has been telling family members that she wants to be gone and free from the world; has been making statements about wanting to hurt herself. Hx of similar episodes with medication noncompliance.  The history is provided by the patient.       Prior to Admission medications   Medication Sig Start Date End Date Taking? Authorizing Provider  aspirin  EC 325 MG EC tablet Take 1 tablet (325 mg total) by mouth 2 (two) times daily after a meal. Patient not taking: Reported on 10/04/2022 03/04/18   Orlando Camellia POUR, PA-C  calcium carbonate (OSCAL) 1500 (600 Ca) MG TABS tablet Take 600 mg by mouth every 3 (three) days.    [provider]  celecoxib  (CELEBREX ) 200 MG capsule Take 1 capsule (200 mg total) by mouth 2 (two) times daily. Patient not taking: Reported on 10/04/2022 03/04/18   Orlando Camellia POUR, PA-C  gabapentin  (NEURONTIN ) 300 MG capsule Take 1 capsule (300 mg total) by mouth 3 (three) times daily. 03/04/18   Orlando Camellia POUR, PA-C  meloxicam (MOBIC) 15 MG tablet Take 15 mg by mouth daily. 04/20/22   [provider]   oxyCODONE -acetaminophen  (PERCOCET/ROXICET) 5-325 MG tablet Take 1 tablet by mouth every 4 (four) hours as needed for severe pain. Patient not taking: Reported on 10/04/2022 03/04/18   Orlando Camellia POUR, PA-C  tiZANidine  (ZANAFLEX ) 2 MG tablet Take 1 tablet (2 mg total) by mouth every 6 (six) hours as needed. 03/04/18   Orlando Camellia POUR, PA-C  vitamin B-12 (CYANOCOBALAMIN) 500 MCG tablet Take 500 mcg by mouth every 3 (three) days.    [provider]  vitamin E 400 UNIT capsule Take 400 Units by mouth every 3 (three) days.    [provider]    Allergies: Patient has no known allergies.    Review of Systems Ten systems reviewed and are negative for acute change, except as noted in the HPI.    Updated Vital Signs BP 118/72 (BP Location: Right Arm)   Pulse (!) 140   Temp 98.2 F (36.8 C) (Oral)   Resp (!) 22   Ht 5' 3 (1.6 m)   Wt 97.5 kg   SpO2 100%   BMI 38.08 kg/m   Physical Exam Vitals and nursing note reviewed.  Constitutional:      General: She is not in acute distress.    Appearance: She is well-developed. She is not diaphoretic.  HENT:     Head: Normocephalic and atraumatic.  Eyes:     General: No scleral icterus.    Conjunctiva/sclera: Conjunctivae normal.  Cardiovascular:     Rate and Rhythm: Regular  rhythm. Tachycardia present.     Pulses: Normal pulses.  Pulmonary:     Effort: Pulmonary effort is normal. No respiratory distress.     Comments: Respirations even and unlabored Musculoskeletal:        General: Normal range of motion.     Cervical back: Normal range of motion.  Skin:    General: Skin is warm and dry.     Coloration: Skin is not pale.     Findings: No erythema or rash.  Neurological:     Mental Status: She is alert and oriented to person, place, and time.  Psychiatric:        Speech: Speech is rapid and pressured.        Behavior: Behavior is agitated. Behavior is cooperative.        Thought Content: Thought content does not  include homicidal or suicidal ideation.        Judgment: Judgment is impulsive.     (all labs ordered are listed, but only abnormal results are displayed) Labs Reviewed  COMPREHENSIVE METABOLIC PANEL WITH GFR - Abnormal; Notable for the following components:      Result Value   Sodium 134 (*)    Chloride 97 (*)    Glucose, Bld 115 (*)    All other components within normal limits  CBC - Abnormal; Notable for the following components:   WBC 12.4 (*)    MCV 104.0 (*)    MCH 34.5 (*)    All other components within normal limits  ETHANOL  HCG, SERUM, QUALITATIVE  RAPID URINE DRUG SCREEN, HOSP PERFORMED  URINALYSIS, ROUTINE W REFLEX MICROSCOPIC    EKG: None  Radiology: No results found.   Procedures   Medications Ordered in the ED  ziprasidone  (GEODON ) injection 20 mg (20 mg Intramuscular Given 10/16/23 0454)  sterile water  (preservative free) injection (10 mLs  Given 10/16/23 0454)    Clinical Course as of 10/16/23 0631  Wed Oct 16, 2023  0440 Patient appears to have run out of the ambulance bay doors in an attempt to elope from the department. Security called by charge RN in attempt to find patient and bring her back to the ED. [KH]  [phone] Patient has returned to the ED. [KH]  [id number] Concern for patient safety. Geodon  ordered for sedation given recurrent attempts at elopement.  [KH]  [phone] Patient medically cleared. [KH]    Clinical Course User Index [KH] Keith Sor, PA-C                                 Medical Decision Making Amount and/or Complexity of Data Reviewed Labs: ordered.  Risk Prescription drug management.   Patient presenting under involuntary commitment taken out by son.  She has no suicidal or homicidal thoughts, but has been quite agitated since arrival and has tried to elope on a number of occasions.  I have reviewed the patient's labs which appear consistent with her baseline.  She has been medically cleared and is awaiting TTS consultation.   Disposition pending psychiatric recommendations.  Care signed out to oncoming ED provider.     Final diagnoses:  Bipolar affective disorder, remission status unspecified [facility])  Involuntary commitment    ED Discharge Orders     None          Keith Sor, PA-C 10/16/23 0631    Raford Lenis, MD 10/16/23 [phone]

## 2023-10-16 NOTE — ED Notes (Signed)
 Patient got up and ran out the EMS doors.  Patient able to be brought back by staff and security.  Patient tearful and upset about situation she is in.

## 2023-10-16 NOTE — Plan of Care (Signed)
  Problem: Education: Goal: Knowledge of [facility] Education information/materials will improve Outcome: Progressing Goal: Verbalization of understanding the information provided will improve Outcome: Progressing   Problem: Safety: Goal: Periods of time without injury will increase Outcome: Progressing   

## 2023-10-16 NOTE — Progress Notes (Signed)
 Pt has been accepted to [facility] on 10/16/2023 Bed assignment: 306-01  Pt meets inpatient criteria per: Jerel Gravely NP  Attending Physician will be: Dr. Raliegh MD  Report can be called to: Adult unit: [phone]  Pt can arrive after pending UDS and EKG   Care Team Notified: [facility]  Cherylynn Ernst RN, Jerel Gravely NP,   [place] Jayvon Mounger LCSW-A   10/16/2023 10:54 AM
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
## 2023-10-16 NOTE — ED Notes (Signed)
 [facility] paperwork now attached to the clipboard  nurse chandler is aware

## 2023-10-17 LAB — HEMOGLOBIN A1C
Hgb A1c MFr Bld: 4.3 % — ABNORMAL LOW (ref 4.8–5.6)
Mean Plasma Glucose: 76.71 mg/dL

## 2023-10-17 LAB — TSH: TSH: 0.25 u[IU]/mL — ABNORMAL LOW (ref 0.350–4.500)

## 2023-10-17 LAB — LIPID PANEL
Cholesterol: 119 mg/dL (ref 0–200)
HDL: 56 mg/dL (ref >40)
LDL Cholesterol: 49 mg/dL (ref 0–99)
Total CHOL/HDL Ratio: 2.1 ratio
Triglycerides: 68 mg/dL (ref <150)
VLDL: 14 mg/dL (ref 0–40)

## 2023-10-17 LAB — FOLATE: Folate: 8.2 ng/mL (ref >5.9)

## 2023-10-17 LAB — VITAMIN B12: Vitamin B-12: 481 pg/mL (ref 180–914)

## 2023-10-17 MED ORDER — TIZANIDINE HCL 2 MG PO TABS
2.0000 mg | ORAL_TABLET | Freq: Four times a day (QID) | ORAL | Status: DC | PRN
  Administered 2023-10-17 – 2023-10-20 (×6): 2 mg via ORAL
  Filled 2023-10-17 (×6): qty 1

## 2023-10-17 MED ORDER — NAPROXEN 375 MG PO TABS
375.0000 mg | ORAL_TABLET | Freq: Three times a day (TID) | ORAL | Status: DC
  Administered 2023-10-17 – 2023-10-21 (×12): 375 mg via ORAL
  Filled 2023-10-17 (×12): qty 1

## 2023-10-17 NOTE — Plan of Care (Signed)
   Problem: Education: Goal: Knowledge of [place] [facility] Education information/materials will improve Outcome: Progressing Goal: Emotional status will improve Outcome: Progressing Goal: Mental status will improve Outcome: Progressing

## 2023-10-17 NOTE — Plan of Care (Signed)
   Problem: Activity: Goal: Interest or engagement in activities will improve Outcome: Progressing   Problem: Health Behavior/Discharge Planning: Goal: Compliance with treatment plan for underlying cause of condition will improve Outcome: Progressing   Problem: Safety: Goal: Periods of time without injury will increase Outcome: Progressing

## 2023-10-17 NOTE — [facility] Group Notes (Signed)
 Bernstein did not attend NA  wrap up group

## 2023-10-17 NOTE — [facility] Counselor (Signed)
 CSW attempted to meet with patient for completion of PSA however pt was in group. CSW to make another attempt at a later time.  Ryanne Morand, LCSWA 10/17/23 3:21PM

## 2023-10-17 NOTE — Progress Notes (Signed)
   10/17/23 0742  Psych Admission Type (Psych Patients Only)  Admission Status Involuntary  Psychosocial Assessment  Patient Complaints Anxiety  Eye Contact Fair  Facial Expression Flat  Affect Appropriate to circumstance  Speech Logical/coherent  Interaction Assertive  Motor Activity Slow  Appearance/Hygiene Unremarkable  Behavior Characteristics Cooperative;Appropriate to situation  Mood Anxious  Thought Process  Coherency Circumstantial  Content Blaming others  Delusions None reported or observed  Perception WDL  Hallucination None reported or observed  Judgment Poor  Confusion None  Danger to Self  Current suicidal ideation? Denies  Agreement Not to Harm Self Yes  Description of Agreement verbal  Danger to Others  Danger to Others None reported or observed

## 2023-10-17 NOTE — Group Note (Addendum)
 LCSW Group Therapy Note   Group Date: 10/17/2023 Start Time: 1100 End Time: 1200   Participation:  patient was present and actively participated in the conversation.  Type of Therapy:  Group Therapy  Topic:  Lifestyle:  from "One Day" to "Today is Day One"  Objective:  To promote mental and physical well-being through lifestyle changes in routine, nutrition, sleep, and movement.  Goals: Increase awareness of how lifestyle habits impact mental health. Encourage one small, achievable wellness goal. Support group sharing and accountability.  Summary:  Group members explored how daily habits influence mental health and discussed the importance of starting with small, manageable changes. Participants identified personal goals and shared reflections on improving structure, sleep, diet, and physical activity.  Therapeutic Modalities: CBT - Identifying and challenging all-or-nothing thinking; promoting realistic, helpful thoughts about change. Psychoeducation - Teaching about the impact of sleep, nutrition, movement, and routine on mental health. Motivational Interviewing - Eliciting personal motivation and exploring readiness for change. Goal-Setting - Supporting SMART goals to build self-efficacy and encourage follow-through.   Angeline Trick O Tiara Bartoli, LCSWA 10/17/2023  12:18 PM

## 2023-10-17 NOTE — H&P (Signed)
 Psychiatric Admission Assessment Adult  Patient Identification: [name]  MRN:  [id number]  Date of Evaluation:  10/17/2023  Chief Complaint:  Bipolar I disorder, most recent episode (or current) depressed, severe, specified as with psychotic behavior (HCC) [F31.5]  Principal Diagnosis: Bipolar I disorder, most recent episode (or current) depressed, severe, specified as with psychotic behavior (HCC)  Diagnosis:  Principal Problem:   Bipolar I disorder, most recent episode (or current) depressed, severe, specified as with psychotic behavior (HCC)  History of Present Illness: This is the first psychiatric admission in this [facility] for this 34 year olf AA female with previous hx of bipolar disorder. Admitted to the [facility] from the [facility] hospital ED with complaint of auditory/visual hallucinations as reported by patient's family that (apparently, patient was hearing God & seeing a woman talking to her). Chart review reports also indicated that patient did tell family members that she wants to be gone & free from the world. Chart review reports also indicated that patient may have received treatments for mental health issues & has been non-compliant to her treatment regimen. Her UDS was positive for THC. A review of her vital signs has shown her pulse rate is elevated at 120 beats per minute. Patient reports during this evaluation,   Me & my husband were arguing 2 days ago. I thought that we were just having an argument. Then the cops showed up. It has to be either my husband or my son who called the cops. The cops asked me about what was going on? I did tell them about what was going on. They told me that I have to go to the hospital, which I resisted because I did not want to go to the hospital. They put handcuffs on me. I think they did that to me because they were following instructions from either my husband or my son. They were telling the cops exactly how I was feeling the last time I was  taken to the [facility] last year. It has been about a year ago that happened. At the at time, I did black-out but my husband described it as a mental/nervous breakdown.I still call it black-out. It has to be because I was told I was saying stuff that I don't remember saying. I think that could be what happened this time. When I get stressed out, that happens to me. I have been feeling very stressed lately because our bills keep piling-up. Since I'm here, I will try the medicine you guys think might help me. I know when I was at [facility], they put me on this medicine that helped me a lot. But when I ran out of it, I did not have a way to refill it. This medicine is called Razodone. Since I do not have this medicine, I started buying this herbal supplement called Ashungmol to help me stay focused. I do not use any drugs or alcohol but I do smoke weed once daily to help me relax & calm down.  Patient currently denies any SIHI, AVH, delusional thoughts or paranoia. She does not appear to be responding to any internal stimuli. However, she presents a bit disorganized & guarded.  Associated Signs/Symptoms:  Depression Symptoms:  psychomotor agitation, difficulty concentrating, anxiety,  (Hypo) Manic Symptoms:  Hallucinations, Labiality of Mood,  Anxiety Symptoms:  Excessive Worry,  Psychotic Symptoms:  Hallucinations: Auditory Visual  PTSD Symptoms: NA  Total Time spent with patient: 1.5 hours  Past Psychiatric History: Bipolar disorder diagnosed  in 2008. Patient reports has been hospitalized at the [facility] psychiatric hospital. Unable to remember the previous psychiatric medications taken or used.   Is the patient at risk to self? No.  Has the patient been a risk to self in the past 6 months? Yes.    Has the patient been a risk to self within the distant past? Unsure.  Is the patient a risk to others? No.  Has the patient been a risk to others in the past 6 months? No.   Has the patient been a risk to others within the distant past? No.   [place] Scale:  Flowsheet Row [facility]) [facility]27/2023 in [facility] Department at [facility] ED from 10/04/2022 in [facility] Department at [facility]  C-SSRS RISK CATEGORY No Risk No Risk No Risk    Prior Inpatient Therapy: Yes.   If yes, describe: Vikki Ellison.  Prior Outpatient Therapy: No. If yes, describe: NA   Alcohol Screening: 1. How often do you have a drink containing alcohol?: Never 2. How many drinks containing alcohol do you have on a typical day when you are drinking?: 1 or 2 3. How often do you have six or more drinks on one occasion?: Never AUDIT-C Score: 0 4. How often during the last year have you found that you were not able to stop drinking once you had started?: Never 5. How often during the last year have you failed to do what was normally expected from you because of drinking?: Never 6. How often during the last year have you needed a first drink in the morning to get yourself going after a heavy drinking session?: Never 7. How often during the last year have you had a feeling of guilt of remorse after drinking?: Never 8. How often during the last year have you been unable to remember what happened the night before because you had been drinking?: Never 9. Have you or someone else been injured as a result of your drinking?: No 10. Has a relative or friend or a doctor or another health worker been concerned about your drinking or suggested you cut down?: No Alcohol Use Disorder Identification Test Final Score (AUDIT): 0 Alcohol Brief Interventions/Follow-up: Patient Refused  Substance Abuse History in the last 12 months:  Yes.    Consequences of Substance Abuse: Discussed with patient during this admission evaluation. Medical Consequences:  Liver damage, Possible death by  overdose Legal Consequences:  Arrests, jail time, Loss of driving privilege. Family Consequences:  Family discord, divorce and or separation.  Previous Psychotropic Medications: Yes   Psychological Evaluations: Yes   Past Medical History:  Past Medical History:  Diagnosis Date   Anemia yrs ago   Anxiety yrs ago   Arthritis    oa   Depression yrs ago    Past Surgical History:  Procedure Laterality Date   TOTAL HIP ARTHROPLASTY Right 11/11/2017   Procedure: RIGHT TOTAL HIP ARTHROPLASTY ANTERIOR APPROACH;  Surgeon: Liam Lerner, MD;  Location: [facility];  Service: Orthopedics;  Laterality: Right;   TOTAL HIP ARTHROPLASTY Left 03/03/2018   Procedure: TOTAL HIP ARTHROPLASTY ANTERIOR APPROACH;  Surgeon: Sheril Coy, MD;  Location: [facility];  Service: Orthopedics;  Laterality: Left;   TUBAL LIGATION     Family History: History reviewed. No pertinent family history.  Family Psychiatric  History: Patient currently denies any familial hx of mental illnesses.  Tobacco Screening:  Social History   Tobacco Use  Smoking Status Every Day   Current packs/day: 0.25   Average packs/day: 0.3 packs/day for 24.0 years (6.0 ttl pk-yrs)   Types: Cigarettes  Smokeless Tobacco Never    BH Tobacco Counseling     Are you interested in Tobacco Cessation Medications?  Yes, implement Nicotene Replacement Protocol Counseled patient on smoking cessation:  Yes Reason Tobacco Screening Not Completed: No value filed.       Social History: Married, has 3 children, lives in [place], employed. Social History   Substance and Sexual Activity  Alcohol Use Not Currently     Social History   Substance and Sexual Activity  Drug Use Yes   Types: Marijuana   Comment: Hx of THC use frequently    Additional Social History:  Allergies:  No Known Allergies  Lab Results:  Results for orders placed or performed during the hospital encounter of 10/16/23 (from the past 48 hours)  Lipid panel     Status:  None   Collection Time: 10/17/23  6:18 AM  Result Value Ref Range   Cholesterol 119 0 - 200 mg/dL    Comment:        ATP III CLASSIFICATION:  <200     mg/dL   Desirable  799-760  mg/dL   Borderline High  >=759    mg/dL   High           Triglycerides 68 <150 mg/dL   HDL 56 >59 mg/dL   Total CHOL/HDL Ratio 2.1 RATIO   VLDL 14 0 - 40 mg/dL   LDL Cholesterol 49 0 - 99 mg/dL    Comment:        Total Cholesterol/HDL:CHD Risk Coronary Heart Disease Risk Table                     Men   Women  1/2 Average Risk   3.4   3.3  Average Risk       5.0   4.4  2 X Average Risk   9.6   7.1  3 X Average Risk  23.4   11.0        Use the calculated Patient Ratio above and the CHD Risk Table to determine the patient's CHD Risk.        ATP III CLASSIFICATION (LDL):  <100     mg/dL   Optimal  899-870  mg/dL   Near or Above                    Optimal  130-159  mg/dL   Borderline  839-810  mg/dL   High  >809     mg/dL   Very High Performed at [facility], [address] [place], [place] [address]   Hemoglobin A1c     Status: Abnormal   Collection Time: 10/17/23  6:18 AM  Result Value Ref Range   Hgb A1c MFr Bld 4.3 (L) 4.8 - 5.6 %    Comment: (NOTE) Diagnosis of Diabetes The following HbA1c ranges recommended by the American Diabetes Association (ADA) may be used as an aid in the diagnosis of diabetes mellitus.  Hemoglobin             Suggested A1C NGSP%              Diagnosis  <5.7                   Non Diabetic  5.7-6.4  Pre-Diabetic  >6.4                   Diabetic  <7.0                   Glycemic control for                       adults with diabetes.     Mean Plasma Glucose 76.71 mg/dL    Comment: Performed at [facility], [address] [place], [place] [address]   Blood Alcohol level:  Lab Results  Component Value Date   [facility] <15 10/16/2023   ETH <10 10/03/2022   Metabolic Disorder Labs:  Lab Results  Component Value Date    HGBA1C 4.3 (L) 10/17/2023   MPG 76.71 10/17/2023   No results found for: PROLACTIN Lab Results  Component Value Date   CHOL 119 10/17/2023   TRIG 68 10/17/2023   HDL 56 10/17/2023   CHOLHDL 2.1 10/17/2023   VLDL 14 10/17/2023   LDLCALC 49 10/17/2023   Current Medications: Current Facility-Administered Medications  Medication Dose Route Frequency Provider Last Rate Last Admin   acetaminophen  (TYLENOL ) tablet 650 mg  650 mg Oral Q6H PRN Mannie Jerel PARAS, NP   650 mg at 10/17/23 0746   alum & mag hydroxide-simeth (MAALOX/MYLANTA) 200-200-20 MG/5ML suspension 30 mL  30 mL Oral Q4H PRN Mannie Jerel PARAS, NP       haloperidol  (HALDOL ) tablet 5 mg  5 mg Oral TID PRN Mannie Jerel PARAS, NP       And   diphenhydrAMINE  (BENADRYL ) capsule 50 mg  50 mg Oral TID PRN Mannie Jerel PARAS, NP       haloperidol  lactate (HALDOL ) injection 5 mg  5 mg Intramuscular TID PRN Mannie Jerel PARAS, NP       And   diphenhydrAMINE  (BENADRYL ) injection 50 mg  50 mg Intramuscular TID PRN Mannie Jerel PARAS, NP       And   LORazepam  (ATIVAN ) injection 2 mg  2 mg Intramuscular TID PRN Mannie Jerel PARAS, NP       haloperidol  lactate (HALDOL ) injection 10 mg  10 mg Intramuscular TID PRN Mannie Jerel PARAS, NP       And   diphenhydrAMINE  (BENADRYL ) injection 50 mg  50 mg Intramuscular TID PRN Mannie Jerel PARAS, NP       And   LORazepam  (ATIVAN ) injection 2 mg  2 mg Intramuscular TID PRN Mannie Jerel PARAS, NP       gabapentin  (NEURONTIN ) capsule 300 mg  300 mg Oral BID Mannie Jerel PARAS, NP   300 mg at 10/17/23 9356   magnesium  hydroxide (MILK OF MAGNESIA) suspension 30 mL  30 mL Oral Daily PRN Mannie Jerel PARAS, NP       nicotine  (NICODERM CQ  - dosed in mg/24 hours) patch 21 mg  21 mg Transdermal Q0600 Mannie Jerel PARAS, NP   21 mg at 10/17/23 0746   risperiDONE  (RISPERDAL ) tablet 1 mg  1 mg Oral BID Mannie Jerel PARAS, NP   1 mg at 10/17/23 0746   traZODone  (DESYREL ) tablet 100 mg  100 mg Oral QHS PRN Mannie Jerel PARAS, NP   100  mg at 10/17/23 0137   PTA Medications: Medications Prior to Admission  Medication Sig Dispense Refill Last Dose/Taking   aspirin  EC 325 MG EC tablet Take 1 tablet (325 mg total) by mouth 2 (two) times daily after a meal. (Patient  taking differently: Take 325 mg by mouth daily as needed for mild pain (pain score 1-3) or moderate pain (pain score 4-6).) 30 tablet 0    gabapentin  (NEURONTIN ) 300 MG capsule Take 1 capsule (300 mg total) by mouth 3 (three) times daily. (Patient taking differently: Take 300 mg by mouth 2 (two) times daily.) 90 capsule 0    meloxicam (MOBIC) 15 MG tablet Take 15 mg by mouth daily as needed for pain.      tiZANidine  (ZANAFLEX ) 2 MG tablet Take 1 tablet (2 mg total) by mouth every 6 (six) hours as needed. (Patient not taking: Reported on 10/16/2023) 60 tablet 0    AIMS:  ,  ,  ,  ,  ,  ,    Musculoskeletal: Strength & Muscle Tone: within normal limits Gait & Station: normal Patient leans: N/A  Psychiatric Specialty Exam:  Presentation  General Appearance: Casual (Guarded.)  Eye Contact:Good  Speech:Clear and Coherent; Normal Rate  Speech Volume:Increased  Handedness:Right  Mood and Affect  Mood:Anxious  Affect:Congruent  Thought Process  Thought Processes:Disorganized  Duration of Psychotic Symptoms:Greater than 2 days.  Past Diagnosis of Schizophrenia or Psychoactive disorder: No data recorded  Descriptions of Associations:Tangential (Patient jumps from topic to topic.)  Orientation:Partial  Thought Content:Tangential; Scattered  Hallucinations:Hallucinations: -- (Denies)  Ideas of Reference:None  Suicidal Thoughts:Suicidal Thoughts: No  Homicidal Thoughts:Homicidal Thoughts: No   Sensorium  Memory:Immediate Fair; Recent Fair; Remote Fair  Judgment:Fair  Insight:Fair   Executive Functions  Concentration:Fair  Attention Span:Fair  Recall:Fair  Fund of Knowledge:Fair  Language:Good   Psychomotor Activity  Psychomotor  Activity:Psychomotor Activity: Normal  Assets  Assets:Communication Skills; Desire for Improvement; Housing; Social Support   Sleep  Sleep:Sleep: Good  Estimated Sleeping Duration (Last 24 Hours): 8.00-8.50 hours  Physical Exam: Physical Exam Vitals and nursing note reviewed.  HENT:     Head: Normocephalic.     Nose: Nose normal.     Mouth/Throat:     Pharynx: Oropharynx is clear.  Cardiovascular:     Rate and Rhythm: Normal rate.     Comments: Elevated pulse rate: 120. Pulmonary:     Effort: Pulmonary effort is normal.  Genitourinary:    Comments: Deferred Musculoskeletal:        General: Normal range of motion.     Cervical back: Normal range of motion.  Skin:    General: Skin is dry.  Neurological:     Mental Status: She is alert.     Comments: Patient is oriented to self/family.    Review of Systems  Constitutional:  Negative for chills, diaphoresis and fever.  HENT:  Negative for congestion and sore throat.   Respiratory:  Negative for cough, shortness of breath and wheezing.   Cardiovascular:  Negative for chest pain and palpitations.  Gastrointestinal:  Negative for abdominal pain, constipation, diarrhea, heartburn, nausea and vomiting.  Genitourinary:  Negative for dysuria.  Musculoskeletal:  Negative for joint pain and myalgias.  Neurological:  Negative for dizziness, tingling, tremors, sensory change, speech change, focal weakness, seizures, loss of consciousness, weakness and headaches.  Endo/Heme/Allergies:        NKDA  Psychiatric/Behavioral:  Positive for depression, hallucinations (UDS (+) for THC.), substance abuse and suicidal ideas. Negative for memory loss. The patient is nervous/anxious and has insomnia.    Blood pressure 106/88, pulse (!) 120, temperature (!) 100.9 F (38.3 C), temperature source Oral, resp. rate 16, height 5' 3 (1.6 m), weight 87 kg, SpO2 100%. Body mass index is 33.98  kg/m.  Treatment Plan Summary: Daily contact with  patient to assess and evaluate symptoms and progress in treatment and Medication management.   Principal/active diagnoses.  Bipolar I disorder, most recent episode (or current) depressed, severe, specified as with psychotic behavior (HCC)  Plan: The risks/benefits/side-effects/alternatives to the medications in use were discussed in detail with the patient and time was given for patient's questions. The patient consents to medication trial.   -Continue gabapentin  300 mg po bid for pain.  -Continue Risperdal  1 mg po bid for mood control.  -Continue Trazodone  100 mg po Q hs prn for anxiety.  -Continue Nicotine  patch 21 mg trans-dermally q 24 hrs for smoking cessation.  Agitation protocols.  -Continue as recommended (see MAR).   Other medical conditions.  Resumed on Zanaflex  2 mg po Q 6 hrs prn.  -Continue Naprosyn  375 mg po tid with meals for arthritic pain.   Other PRNS -Continue Tylenol  650 mg every 6 hours PRN for mild pain -Continue Maalox 30 ml Q 4 hrs PRN for indigestion -Continue MOM 30 ml po Q 6 hrs for constipation  Safety and Monitoring: Voluntary admission to inpatient psychiatric unit for safety, stabilization and treatment Daily contact with patient to assess and evaluate symptoms and progress in treatment Patient's case to be discussed in multi-disciplinary team meeting Observation Level : q15 minute checks Vital signs: q12 hours Precautions: Safety  Discharge Planning: Social work and case management to assist with discharge planning and identification of hospital follow-up needs prior to discharge Estimated LOS: 5-7 days Discharge Concerns: Need to establish a safety plan; Medication compliance and effectiveness Discharge Goals: Return home with outpatient referrals for mental health follow-up including medication management/psychotherapy  Observation Level/Precautions:  15 minute checks  Laboratory:  Per ED. Current lab results reviewed.  Psychotherapy: Enrolled  in the group sessions.   Medications: See MAR.  Consultations:  As needed.  Discharge Concerns: Safety, mood stability.    Estimated LOS: 3-5 days.  Other: NA   Physician Treatment Plan for Primary Diagnosis: Bipolar I disorder, most recent episode (or current) depressed, severe, specified as with psychotic behavior (HCC)  Long Term Goal(s): Improvement in symptoms so as ready for discharge  Short Term Goals: Ability to identify changes in lifestyle to reduce recurrence of condition will improve, Ability to verbalize feelings will improve, Ability to disclose and discuss suicidal ideas, and Ability to demonstrate self-control will improve  Physician Treatment Plan for Secondary Diagnosis: Principal Problem:   Bipolar I disorder, most recent episode (or current) depressed, severe, specified as with psychotic behavior (HCC)  Long Term Goal(s): Improvement in symptoms so as ready for discharge  Short Term Goals: Ability to identify and develop effective coping behaviors will improve, Ability to maintain clinical measurements within normal limits will improve, Compliance with prescribed medications will improve, and Ability to identify triggers associated with substance abuse/mental health issues will improve  I certify that inpatient services furnished can reasonably be expected to improve the patient's condition.    Mac Bolster, NP, pmhnp, fnp-bc. 9/11/202512:27 PM

## 2023-10-17 NOTE — [facility] Suicide Risk Assessment (Signed)
 Suicide Risk Assessment  Admission Assessment    [facility] Admission Suicide Risk Assessment   Nursing information obtained from:  Patient  Demographic factors:  Low socioeconomic status  Current Mental Status:  Suicidal ideation indicated by others  Loss Factors:  Financial problems / change in socioeconomic status  Historical Factors:  Family history of mental illness or substance abuse  Risk Reduction Factors:  Sense of responsibility to family, Living with another person, especially a relative, Positive social support  Total Time spent with patient: 1.5 hours  Principal Problem: Bipolar I disorder, most recent episode (or current) depressed, severe, specified as with psychotic behavior (HCC)  Diagnosis:  Principal Problem:   Bipolar I disorder, most recent episode (or current) depressed, severe, specified as with psychotic behavior (HCC)  Subjective Data: See H&P.  Continued Clinical Symptoms:  Alcohol Use Disorder Identification Test Final Score (AUDIT): 0 The Alcohol Use Disorders Identification Test, Guidelines for Use in Primary Care, Second Edition.  World Science writer [facility]). Score between 0-7:  no or low risk or alcohol related problems. Score between 8-15:  moderate risk of alcohol related problems. Score between 16-19:  high risk of alcohol related problems. Score 20 or above:  warrants further diagnostic evaluation for alcohol dependence and treatment.  CLINICAL FACTORS:   Severe Anxiety and/or Agitation Bipolar Disorder:   Mixed State Alcohol/Substance Abuse/Dependencies Unstable or Poor Therapeutic Relationship Previous Psychiatric Diagnoses and Treatments   Musculoskeletal: Strength & Muscle Tone: within normal limits Gait & Station: normal Patient leans: N/A  Psychiatric Specialty Exam:  Presentation  General Appearance: Casual (Guarded.)  Eye Contact:Good  Speech:Clear and Coherent; Normal Rate  Speech  Volume:Increased  Handedness:Right   Mood and Affect  Mood:Anxious  Affect:Congruent   Thought Process  Thought Processes:Disorganized  Descriptions of Associations:Tangential (Patient jumps from topic to topic.)  Orientation:Partial  Thought Content:Tangential; Scattered  History of Schizophrenia/Schizoaffective disorder:No data recorded Duration of Psychotic Symptoms:No data recorded Hallucinations:Hallucinations: -- (Denies)  Ideas of Reference:None  Suicidal Thoughts:Suicidal Thoughts: No  Homicidal Thoughts:Homicidal Thoughts: No   Sensorium  Memory:Immediate Fair; Recent Fair; Remote Fair  Judgment:Fair  Insight:Fair   Executive Functions  Concentration:Fair  Attention Span:Fair  Recall:Fair  Fund of Knowledge:Fair  Language:Good   Psychomotor Activity  Psychomotor Activity:Psychomotor Activity: Normal   Assets  Assets:Communication Skills; Desire for Improvement; Housing; Social Support   Sleep  Sleep:Sleep: Good Number of Hours of Sleep: 7.5  Physical Exam: See H&P.  Blood pressure 106/88, pulse (!) 120, temperature (!) 100.9 F (38.3 C), temperature source Oral, resp. rate 16, height 5' 3 (1.6 m), weight 87 kg, SpO2 100%. Body mass index is 33.98 kg/m.   COGNITIVE FEATURES THAT CONTRIBUTE TO RISK:  Thought constriction (tunnel vision)    SUICIDE RISK:   Moderate:  Frequent suicidal ideation with limited intensity, and duration, some specificity in terms of plans, no associated intent, good self-control, limited dysphoria/symptomatology, some risk factors present, and identifiable protective factors, including available and accessible social support.  PLAN OF CARE: See H&P.  I certify that inpatient services furnished can reasonably be expected to improve the patient's condition.   Mac Bolster, NP, pmhnp, fnp-bc. 10/17/2023, 12:25 PM

## 2023-10-17 NOTE — Group Note (Unsigned)
 Date:  10/18/2023 Time:  2:00 AM  Group Topic/Focus:  Wrap-Up Group:   The focus of this group is to help patients review their daily goal of treatment and discuss progress on daily workbooks.    Participation Level:  Active  Participation Quality:  Appropriate and Sharing  Affect:  Appropriate  Cognitive:  Appropriate  Insight: Appropriate  Engagement in Group:  Engaged  Modes of Intervention:  Activity and Socialization  Additional Comments:  Patient activity participated and engaged in wrap up group. Patient shared about her day and things she would like to work on while here.    Eward Mace 10/18/2023, 2:00 AM

## 2023-10-17 NOTE — Group Note (Signed)
 Date:  10/17/2023 Time:  5:17 PM  Group Topic/Focus:  Coping With Mental Health Crisis:   The purpose of this group is to help patients identify strategies for coping with mental health crisis.  Group discusses possible causes of crisis and ways to manage them effectively. Developing a Wellness Toolbox:   The focus of this group is to help patients develop a wellness toolbox with skills and strategies to promote recovery upon discharge. Wellness Toolbox:   The focus of this group is to discuss various aspects of wellness, balancing those aspects and exploring ways to increase the ability to experience wellness.  Patients will create a wellness toolbox for use upon discharge.    Participation Level:  Minimal  Participation Quality:  Appropriate  Affect:  Appropriate  Cognitive:  Appropriate  Insight: Appropriate  Engagement in Group:  Engaged  Modes of Intervention:  Discussion  Additional Comments:    [name] Metro 10/17/2023, 5:17 PM

## 2023-10-17 NOTE — Group Note (Signed)
 Occupational Therapy Group Note   Group Topic:Goal Setting  Group Date: 10/17/2023 Start Time: 1500 End Time: 1530 Facilitators: Dot [place] MATSU, OT   Group Description: Group encouraged engagement and participation through discussion focused on goal setting. Group members were introduced to goal-setting using the SMART Goal framework, identifying goals as Specific, Measureable, Acheivable, Relevant, and Time-Bound. Group members took time from group to create their own personal goal reflecting the SMART goal template and shared for review by peers and OT.    Therapeutic Goal(s):  Identify at least one goal that fits the SMART framework    Participation Level: Engaged   Participation Quality: Independent   Behavior: Appropriate   Speech/Thought Process: Relevant   Affect/Mood: Appropriate   Insight: Fair   Judgement: Fair      Modes of Intervention: Education  Patient Response to Interventions:  Attentive   Plan: Continue to engage patient in OT groups 2 - 3x/week.  10/17/2023  [place] MATSU Dot, OT  Tovia Kisner, OT

## 2023-10-17 NOTE — Progress Notes (Addendum)
(  Sleep Hours) -8.25 (Any PRNs that were needed, meds refused, or side effects to meds)- prn tylenol  @ 2133, trazodone  @ 0137 (Any disturbances and when (visitation, over night)-none (Concerns raised by the patient)- none (SI/HI/AVH)- Denies all  Pt c/o finger tips going numb and states that usually happens when her meds are due. Pt ask for gabapentin  early

## 2023-10-18 ENCOUNTER — Encounter (HOSPITAL_COMMUNITY): Payer: Self-pay

## 2023-10-18 LAB — HIV ANTIBODY (ROUTINE TESTING W REFLEX): HIV Screen 4th Generation wRfx: NONREACTIVE

## 2023-10-18 LAB — RPR: RPR Ser Ql: NONREACTIVE

## 2023-10-18 LAB — VITAMIN D 25 HYDROXY (VIT D DEFICIENCY, FRACTURES): Vit D, 25-Hydroxy: 22.69 ng/mL — ABNORMAL LOW (ref 30–100)

## 2023-10-18 LAB — T4, FREE: Free T4: 1.13 ng/dL — ABNORMAL HIGH (ref 0.61–1.12)

## 2023-10-18 MED ORDER — WHITE PETROLATUM EX OINT
TOPICAL_OINTMENT | CUTANEOUS | Status: AC
  Administered 2023-10-18: 1
  Filled 2023-10-18: qty 5

## 2023-10-18 MED ORDER — VITAMIN D (ERGOCALCIFEROL) 1.25 MG (50000 UNIT) PO CAPS
50000.0000 [IU] | ORAL_CAPSULE | Freq: Every day | ORAL | Status: DC
Start: 2023-10-18 — End: 2023-10-21
  Administered 2023-10-18 – 2023-10-21 (×4): 50000 [IU] via ORAL
  Filled 2023-10-18 (×5): qty 1

## 2023-10-18 NOTE — Progress Notes (Signed)
 Adult Psychoeducational Group Note  Date:  10/18/2023 Time:  9:42 AM  Group Topic/Focus:  Goals Group:   The focus of this group is to help patients establish daily goals to achieve during treatment and discuss how the patient can incorporate goal setting into their daily lives to aide in recovery.  Participation Level:  Active  Participation Quality:  Appropriate  Affect:  Appropriate  Cognitive:  Appropriate  Insight: Appropriate  Engagement in Group:  Engaged  Modes of Intervention:  Discussion  Additional Comments:  Pt goal for the day is work on mental wellbeing and attend groups.  [name] 10/18/2023, 9:42 AM

## 2023-10-18 NOTE — [facility] Group Notes (Signed)
 [facility] Group Notes:  (Nursing/MHT/Case Management/Adjunct)  Date:  10/18/2023  Time:  8:44 PM  Type of Therapy:  AA Group  Participation Level:  Active  Participation Quality:  Appropriate  Affect:  Appropriate  Cognitive:  Appropriate  Insight:  Appropriate  Engagement in Group:  Engaged  Modes of Intervention:  Education  Summary of Progress/Problems: Attended AA group.  [name] 10/18/2023, 8:44 PM

## 2023-10-18 NOTE — Progress Notes (Signed)
(  Sleep Hours) -5.25 (Any PRNs that were needed, meds refused, or side effects to meds)-none  (Any disturbances and when (visitation, over night)-none (Concerns raised by the patient)- none (SI/HI/AVH)- Denies all

## 2023-10-18 NOTE — Progress Notes (Signed)
 [facility] MD Progress Note  10/18/2023 9:04 AM [name]  MRN:  [id number]  Reason for admission:  49 year olf AA female with previous hx of bipolar disorder. Admitted to the [facility] from the [facility] hospital ED with complaint of auditory/visual hallucinations as reported by patient's family that (apparently, patient was hearing God & seeing a woman talking to her). Chart review reports also indicated that patient did tell family members that she wants to be gone & free from the world. Chart review reports also indicated that patient may have received treatments for mental health issues & has been non-compliant to her treatment regimen. Her UDS was positive for THC.   Daily notes: Patient is seen this morning. Chart reviewed. The chart findings discussed with the treatment team. She presents alert, oriented & aware of situation. She is visible on the unit, attending group sessions. She presents with an improving affect, good eye contact & verbally responsive. She reports, My mood is good. I feel my energy coming back. I took my medicines yesterday & today. I feel good about the medicines because I have no side effects to report. Besides, I think I have been on Risperdal  & Vitamin D  in the past when I was hospitalized at the [facility]. I slept well last night. I have been attending group sessions. The group topics are inspirational & engaging. My husband visited me yesterday evening. It was a good visit. He came with my son, but my son has to stay back in the car because only one visitor is allowed. That made me feel bad. I also do want to know when I can expect to be discharged from here because I have got to go back to work.  Patient currently denies any SIHI, AVH, delusional thoughts or paranoia. She does not appear to be responding to any internal stimuli. About discharge, patient is informed that there is no set date for her discharge as of today. She is informed that her length of hospital stay is  between 3-5 days pending how she is doing on her medications. She is instructed & encouraged to continue to take her medications as recommended as we monitor how she is doing on daily basis. Reviewed current lab results. Her TSH is 0.250 (low). Will obtain T3, T4. Her hgba1c was 4.3. Vital signs within norm. Continue current plan of care as already in progress.   Principal Problem: Bipolar I disorder, most recent episode (or current) depressed, severe, specified as with psychotic behavior (HCC) Diagnosis: Principal Problem:   Bipolar I disorder, most recent episode (or current) depressed, severe, specified as with psychotic behavior (HCC)  Total Time spent with patient: 45 minutes  Past Psychiatric History: See H&P.  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 No pertinent family history.  Family Psychiatric  History: See H&P.  Social History:  Social History  Substance and Sexual Activity  Alcohol Use Not Currently     Social History   Substance and Sexual Activity  Drug Use Yes   Types: Marijuana   Comment: Hx of THC use frequently    Social History   Socioeconomic History   Marital status: Married    Spouse name: Not on file   Number of children: Not on file   Years of education: Not on file   Highest education level: Not on file  Occupational History   Not on file  Tobacco Use   Smoking  status: Every Day    Current packs/day: 0.25    Average packs/day: 0.3 packs/day for 24.0 years (6.0 ttl pk-yrs)    Types: Cigarettes   Smokeless tobacco: Never  Vaping Use   Vaping status: Never Used  Substance and Sexual Activity   Alcohol use: Not Currently   Drug use: Yes    Types: Marijuana    Comment: Hx of THC use frequently   Sexual activity: Not on file  Other Topics Concern   Not on file  Social History Narrative   Not on file   Social Drivers of Health   Financial Resource Strain: Low Risk  (04/20/2022)   Received [facility]   Overall Financial Resource Strain (CARDIA)    Difficulty of Paying Living Expenses: Not hard at all  Food Insecurity: Food Insecurity Present (10/16/2023)   Hunger Vital Sign    Worried About Running Out of Food in the Last Year: Sometimes true    Ran Out of Food in the Last Year: Never true  Transportation Needs: No Transportation Needs (10/16/2023)   PRAPARE - Administrator, Civil Service (Medical): No    Lack of Transportation (Non-Medical): No  Physical Activity: Not on file  Stress: Not on file  Social Connections: Unknown (06/16/2021)   Received from [facility]   Social Network    Social Network: Not on file   Additional Social History:   Sleep: Good Estimated Sleeping Duration (Last 24 Hours): 3.75-4.75 hours  Appetite:  Good  Current Medications: Current Facility-Administered Medications  Medication Dose Route Frequency Provider Last Rate Last Admin   acetaminophen  (TYLENOL ) tablet 650 mg  650 mg Oral Q6H PRN Mannie Jerel PARAS, NP   650 mg at 10/17/23 1500   alum & mag hydroxide-simeth (MAALOX/MYLANTA) 200-200-20 MG/5ML suspension 30 mL  30 mL Oral Q4H PRN Mannie Jerel PARAS, NP       haloperidol  (HALDOL ) tablet 5 mg  5 mg Oral TID PRN Mannie Jerel PARAS, NP       And   diphenhydrAMINE  (BENADRYL ) capsule 50 mg  50 mg Oral TID PRN Mannie Jerel PARAS, NP       haloperidol  lactate (HALDOL ) injection 5 mg  5 mg  Intramuscular TID PRN Mannie Jerel PARAS, NP       And   diphenhydrAMINE  (BENADRYL ) injection 50 mg  50 mg Intramuscular TID PRN Mannie Jerel PARAS, NP       And   LORazepam  (ATIVAN ) injection 2 mg  2 mg Intramuscular TID PRN Mannie Jerel PARAS, NP       haloperidol  lactate (HALDOL ) injection 10 mg  10 mg Intramuscular TID PRN Mannie Jerel PARAS, NP       And   diphenhydrAMINE  (BENADRYL ) injection 50 mg  50 mg Intramuscular TID PRN Mannie Jerel PARAS, NP       And   LORazepam  (ATIVAN ) injection 2 mg  2 mg Intramuscular TID  PRN Mannie Jerel PARAS, NP       gabapentin  (NEURONTIN ) capsule 300 mg  300 mg Oral BID Mannie Jerel PARAS, NP   300 mg at 10/18/23 9250   magnesium  hydroxide (MILK OF MAGNESIA) suspension 30 mL  30 mL Oral Daily PRN Mannie Jerel PARAS, NP       naproxen  (NAPROSYN ) tablet 375 mg  375 mg Oral TID WC Nyree Applegate I, NP   375 mg at 10/18/23 0749   nicotine  (NICODERM CQ  - dosed in mg/24 hours) patch 21 mg  21 mg Transdermal Q0600 Mannie Jerel PARAS, NP   21 mg at 10/18/23 0749   risperiDONE  (RISPERDAL ) tablet 1 mg  1 mg Oral BID Mannie Jerel PARAS, NP   1 mg at 10/18/23 0749   tiZANidine  (ZANAFLEX ) tablet 2 mg  2 mg Oral Q6H PRN Collene Gouge I, NP   2 mg at 10/18/23 0800   traZODone  (DESYREL ) tablet 100 mg  100 mg Oral QHS PRN Mannie Jerel PARAS, NP   100 mg at 10/17/23 9862   Vitamin D  (Ergocalciferol ) (DRISDOL ) 1.25 MG (50000 UNIT) capsule 50,000 Units  50,000 Units Oral Daily Parker, Alvin S, MD   50,000 Units at 10/18/23 9247   Lab Results:  Results for orders placed or performed during the hospital encounter of 10/16/23 (from the past 48 hours)  Lipid panel     Status: None   Collection Time: 10/17/23  6:18 AM  Result Value Ref Range   Cholesterol 119 0 - 200 mg/dL    Comment:        ATP III CLASSIFICATION:  <200     mg/dL   Desirable  799-760  mg/dL   Borderline High  >=759    mg/dL   High           Triglycerides 68 <150 mg/dL   HDL 56 >59 mg/dL   Total CHOL/HDL Ratio 2.1 RATIO    VLDL 14 0 - 40 mg/dL   LDL Cholesterol 49 0 - 99 mg/dL    Comment:        Total Cholesterol/HDL:CHD Risk Coronary Heart Disease Risk Table                     Men   Women  1/2 Average Risk   3.4   3.3  Average Risk       5.0   4.4  2 X Average Risk   9.6   7.1  3 X Average Risk  23.4   11.0        Use the calculated Patient Ratio above and the CHD Risk Table to determine the patient's CHD Risk.        ATP III CLASSIFICATION (LDL):  <100     mg/dL   Optimal  899-870  mg/dL   Near or Above                    Optimal  130-159  mg/dL   Borderline  839-810  mg/dL   High  >809     mg/dL   Very High Performed at [facility], [address], [place], [place] [address]   Hemoglobin A1c     Status: Abnormal   Collection Time: 10/17/23  6:18 AM  Result Value Ref Range   Hgb A1c MFr Bld 4.3 (L) 4.8 - 5.6 %    Comment: (NOTE) Diagnosis of Diabetes The following HbA1c ranges recommended by the American Diabetes Association (ADA)  may be used as an aid in the diagnosis of diabetes mellitus.  Hemoglobin             Suggested A1C NGSP%              Diagnosis  <5.7                   Non Diabetic  5.7-6.4                Pre-Diabetic  >6.4                   Diabetic  <7.0                   Glycemic control for                       adults with diabetes.     Mean Plasma Glucose 76.71 mg/dL    Comment: Performed at [facility], [address] [place], [place] [address]  Folate     Status: None   Collection Time: 10/17/23  6:21 PM  Result Value Ref Range   Folate 8.2 >5.9 ng/mL    Comment: HEMOLYSIS AT THIS LEVEL MAY AFFECT RESULT Performed at [facility], [address], [place], [place] [address]   RPR     Status: None   Collection Time: 10/17/23  6:21 PM  Result Value Ref Range   RPR Ser Ql NON REACTIVE NON REACTIVE    Comment: Performed at [facility], [address], [place], [place] [address]  TSH     Status: Abnormal    Collection Time: 10/17/23  6:21 PM  Result Value Ref Range   TSH 0.250 (L) 0.350 - 4.500 uIU/mL    Comment: Performed at [facility], [address], [place], [place] [address]  Vitamin B12     Status: None   Collection Time: 10/17/23  6:21 PM  Result Value Ref Range   Vitamin B-12 481 180 - 914 pg/mL    Comment: Performed at [facility], [address], [place], [place] [address]  VITAMIN D  25 Hydroxy (Vit-D Deficiency, Fractures)     Status: Abnormal   Collection Time: 10/17/23  6:21 PM  Result Value Ref Range   Vit D, 25-Hydroxy 22.69 (L) 30 - 100 ng/mL    Comment: (NOTE) Vitamin D  deficiency has been defined by the Institute of Medicine  and an Endocrine Society practice guideline as a level of serum 25-OH  vitamin D  less than 20 ng/mL (1,2). The Endocrine Society went on to  further define vitamin D  insufficiency as a level between 21 and 29  ng/mL (2).  1. IOM (Institute of Medicine). 2010. Dietary reference intakes for  calcium and D. [place]: [employer]. 2. Holick MF, Binkley Cortland, Bischoff-Ferrari HA, et al. Evaluation,  treatment, and prevention of vitamin D  deficiency: an Endocrine  Society clinical practice guideline, JCEM. 2011 Jul; 96(7): 1911-30.  Performed at [facility], [address] [place], [place] [address]   HIV Antibody (routine testing w rflx)     Status: None   Collection Time: 10/17/23  6:21 PM  Result Value Ref Range   HIV Screen 4th Generation wRfx Non Reactive Non Reactive    Comment: Performed at [facility], [address], [place], [place] [address]   Blood Alcohol level:  Lab Results  Component  Value Date   [facility] <15 10/16/2023   ETH <10 10/03/2022   Metabolic Disorder Labs: Lab Results  Component Value Date   HGBA1C 4.3 (L) 10/17/2023   MPG 76.71 10/17/2023   No results found for: PROLACTIN Lab Results  Component Value Date   CHOL 119 10/17/2023   TRIG  68 10/17/2023   HDL 56 10/17/2023   CHOLHDL 2.1 10/17/2023   VLDL 14 10/17/2023   LDLCALC 49 10/17/2023   Physical Findings: AIMS:  ,  ,  ,  ,  ,  ,   CIWA:    COWS:     Musculoskeletal: Strength & Muscle Tone: within normal limits Gait & Station: normal Patient leans: N/A  Psychiatric Specialty Exam:  Presentation  General Appearance:  Casual (Guarded.)  Eye Contact: Good  Speech: Clear and Coherent; Normal Rate  Speech Volume: Increased  Handedness: Right   Mood and Affect  Mood: Anxious  Affect: Congruent   Thought Process  Thought Processes: Disorganized  Descriptions of Associations:Tangential (Patient jumps from topic to topic.)  Orientation:Partial  Thought Content:Tangential; Scattered  History of Schizophrenia/Schizoaffective disorder:No data recorded Duration of Psychotic Symptoms:No data recorded Hallucinations:Hallucinations: -- (Denies)  Ideas of Reference:None  Suicidal Thoughts:Suicidal Thoughts: No  Homicidal Thoughts:Homicidal Thoughts: No   Sensorium  Memory: Immediate Fair; Recent Fair; Remote Fair  Judgment: Fair  Insight: Fair   Art therapist  Concentration: Fair  Attention Span: Fair  Recall: Fair  Fund of Knowledge: Fair  Language: Good  Psychomotor Activity  Psychomotor Activity: Psychomotor Activity: Normal   Assets  Assets: Communication Skills; Desire for Improvement; Housing; Social Support  Sleep  Sleep: Sleep: Good Number of Hours of Sleep: 7.5  Physical Exam: Physical Exam Vitals and nursing note reviewed.  HENT:     Head: Normocephalic.     Nose: Nose normal.     Mouth/Throat:     Pharynx: Oropharynx is clear.  Eyes:     Pupils: Pupils are equal, round, and reactive to light.  Cardiovascular:     Rate and Rhythm: Normal rate.     Pulses: Normal pulses.  Pulmonary:     Effort: Pulmonary effort is normal.  Genitourinary:    Comments: Deferred. Musculoskeletal:         General: Normal range of motion.     Cervical back: Normal range of motion.  Skin:    General: Skin is dry.  Neurological:     General: No focal deficit present.     Mental Status: She is alert and oriented to person, place, and time.    Review of Systems  Constitutional:  Negative for chills, diaphoresis and fever.  HENT:  Negative for congestion and sore throat.   Respiratory:  Negative for cough, shortness of breath and wheezing.   Cardiovascular:  Negative for chest pain and palpitations.  Gastrointestinal:  Negative for abdominal pain, constipation, diarrhea, heartburn, nausea and vomiting.  Genitourinary:  Negative for dysuria.  Musculoskeletal:  Negative for joint pain and myalgias.  Skin:  Negative for rash.  Neurological:  Negative for dizziness, tingling, tremors, sensory change, speech change, focal weakness, seizures, loss of consciousness, weakness and headaches.  Psychiatric/Behavioral:  Positive for substance abuse. Negative for suicidal ideas.    Blood pressure 120/79, pulse 84, temperature 98.4 F (36.9 C), temperature source Oral, resp. rate 16, height 5' 3 (1.6 m), weight 87 kg, SpO2 100%. Body mass index is 33.98 kg/m.  Treatment Plan Summary: Daily contact with patient to assess and evaluate symptoms and progress  in treatment and Medication management.   Principal/active diagnoses.  Bipolar I disorder, most recent episode (or current) depressed, severe, specified as with psychotic behavior (HCC)  Plan: The risks/benefits/side-effects/alternatives to the medications in use were discussed in detail with the patient and time was given for patient's questions. The patient consents to medication trial.    -Continue gabapentin  300 mg po bid for pain.  -Continue Risperdal  1 mg po bid for mood control.  -Continue Trazodone  100 mg po Q hs prn for anxiety.  -Continue Nicotine  patch 21 mg trans-dermally q 24 hrs for smoking cessation.   Agitation protocols.   -Continue as recommended (see MAR).    Other medical conditions.  Resumed on Zanaflex  2 mg po Q 6 hrs prn.  -Continue Naprosyn  375 mg po tid with meals for arthritic pain.    Other PRNS -Continue Tylenol  650 mg every 6 hours PRN for mild pain -Continue Maalox 30 ml Q 4 hrs PRN for indigestion -Continue MOM 30 ml po Q 6 hrs for constipation   Safety and Monitoring: Voluntary admission to inpatient psychiatric unit for safety, stabilization and treatment Daily contact with patient to assess and evaluate symptoms and progress in treatment Patient's case to be discussed in multi-disciplinary team meeting Observation Level : q15 minute checks Vital signs: q12 hours Precautions: Safety   Discharge Planning: Social work and case management to assist with discharge planning and identification of hospital follow-up needs prior to discharge Estimated LOS: 5-7 days Discharge Concerns: Need to establish a safety plan; Medication compliance and effectiveness Discharge Goals: Return home with outpatient referrals for mental health follow-up including medication management/psychotherapy  Mac Bolster, NP, pmhnp, fnp-bc. 10/18/2023, 9:04 AM

## 2023-10-18 NOTE — Group Note (Signed)
 Date:  10/18/2023 Time:  4:30 PM  Group Topic/Focus:  Overcoming Stress:   The focus of this group is to define stress and help patients assess their triggers.    Participation Level:  Did Not Attend   Annalee Larch 10/18/2023, 4:30 PM

## 2023-10-18 NOTE — BHH Counselor (Signed)
 Adult Comprehensive Assessment  Patient ID: [name], female   DOB: 23-Oct-1980, 43 y.o.   MRN: [id number]  Information Source:    Current Stressors:  Patient states their primary concerns and needs for treatment are:: I thought I was just having a mental breakdown or blacking out because I don't remember anything that happened. This month is the death anniversary of my mother's death so maybe that's what's triggering these. Patient states their goals for this hospitilization and ongoing recovery are:: I want continue to take my medication that I've think has been helping me. I know I need to work on getting a therapist and talking about things I've been through. Educational / Learning stressors: None reported Employment / Job issues: None reported Family Relationships: None reported Surveyor, quantity / Lack of resources (include bankruptcy): None reported Housing / Lack of housing: None reported Physical health (include injuries & life threatening diseases): None reported Social relationships: None reported Substance abuse: None reported Bereavement / Loss: Patient endorsed the death of her brother in [place] and the death of her mother in 11/13/2017 being difficult for her to open up about. She reported not fully processing their deaths and is worried that the trauma from that has caused her to have blackouts.  Living/Environment/Situation:  Living Arrangements: Spouse/significant other Living conditions (as described by patient or guardian): It's primarily me and my husband but sometimes my son and daughter stay with us . Who else lives in the home?: my son, daughter sometimes (in university), and my husband How long has patient lived in current situation?: 25 years What is atmosphere in current home: Comfortable, Supportive  Family History:  Marital status: Married Number of Years Married: 25 What types of issues is patient dealing with in the relationship?: No concerns  reported Additional relationship information: NA Are you sexually active?: Yes What is your sexual orientation?: Heterosexual Has your sexual activity been affected by drugs, alcohol, medication, or emotional stress?: No Does patient have children?: Yes How many children?: 2 How is patient's relationship with their children?: 16 y.o.daughter and 59 y.o. son - no concerns reported  Childhood History:  By whom was/is the patient raised?: Mother Additional childhood history information: Patient reported her Father left during childhood Description of patient's relationship with caregiver when they were a child: things were good Patient's description of current relationship with people who raised him/her: Mother died in 11/13/17 How were you disciplined when you got in trouble as a child/adolescent?: Patient declined to discuss at time of assessment. Does patient have siblings?: Yes (2 living brothers, 2 deceased) Number of Siblings: 4 Description of patient's current relationship with siblings: my relationship is good with one, but not the other. 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 Patient's Job has Been Impacted by Current Illness: Yes Describe how Patient's Job has Been Impacted: Patient stated it [name] be very stressful at times and a lot to deal with. What is the Longest Time Patient has Held a Job?: 3 years Where was the Patient Employed at that Time?: [employer] Has Patient ever Been in the [employer]?: No  Financial Resources:   Financial resources: Income from employment Does patient have a representative payee or guardian?: No  Alcohol/Substance Abuse:   What has been your use of drugs/alcohol within the last 12 months?: No etoh consumption; tobacco user If attempted suicide, did drugs/alcohol play a role in this?: No Alcohol/Substance Abuse Treatment Hx: Denies past history If yes, describe treatment: N/A Has alcohol/substance abuse ever caused legal problems?: No  Social Support System:   Patient's Community Support System: Good Describe Community Support System: yes, I do. My husband is really supportive of me. Type of faith/religion: I'm spiritual How does patient's faith help to cope with current illness?: I listen to Graceful Purpose on youtube, I read my Bible app every morning  Leisure/Recreation:   Do You Have Hobbies?: No  Strengths/Needs:   What is the patient's perception of their strengths?: I'm really good at my job Patient states they [name] use these personal strengths during their treatment to contribute to their recovery: Helps me stay focused Patient states these barriers may affect/interfere with their treatment: None reported Patient states these barriers may affect their return to the community: None reported Other important information patient would like considered in planning for their treatment: N/A  Discharge Plan:   Currently receiving community mental health services: No Patient states concerns and preferences for aftercare planning are: Outpatient  therapy and medication Patient states they will know when they are safe and ready for discharge when: I'm ready now Does patient have access to transportation?: Yes Does patient have financial barriers related to discharge medications?: Yes Patient description of barriers related to discharge medications: Patient is uninsured at the moment. Will patient be returning to same living situation after discharge?: Yes  Summary/Recommendations:   Summary and Recommendations (to be completed by the evaluator): [name] is a 43 year old female who was involuntarily admitted to [facility] from [facility] ED at [facility] due to auditory and visual hallucinations, as reported by her family. Patient reported she had a 'blackout' during which she's unaware of what took place and what she might have said. She reported remembering the police showing up her home and putting handcuffs on her, then taking her to the hospital for treatment. She reported living with her husband and having an argument, but stated she wasn't sure what occurred for the police to show up. Patient denied the use of illicit, mood-altering substances and denied the consumption of alcohol. Patient's urinary drug screen was positive for THC. Upon assessment, patient was calm and cooperative. She denied currently having a therapy and medication management provider. CSW team to provide patient with follow-up appointments prior to discharge.While here, [name] benefit from crisis stabilization, medication management, therapeutic milieu, and referrals for services.   Syndi Pua M Klover Priestly, LCSWA 10/18/2023

## 2023-10-18 NOTE — Plan of Care (Signed)

## 2023-10-18 NOTE — Progress Notes (Signed)
   10/18/23 0750  Psych Admission Type (Psych Patients Only)  Admission Status Involuntary  Psychosocial Assessment  Patient Complaints None  Eye Contact Fair  Facial Expression Animated  Affect Appropriate to circumstance  Speech Logical/coherent  Interaction Assertive  Motor Activity Slow  Appearance/Hygiene Unremarkable  Behavior Characteristics Cooperative;Appropriate to situation  Mood Pleasant  Thought Process  Coherency WDL  Content WDL  Delusions None reported or observed  Perception WDL  Hallucination None reported or observed  Judgment Poor  Confusion None  Danger to Self  Current suicidal ideation? Denies  Agreement Not to Harm Self Yes  Description of Agreement verbal  Danger to Others  Danger to Others None reported or observed

## 2023-10-18 NOTE — BH IP Treatment Plan (Signed)
 Interdisciplinary Treatment and Diagnostic Plan Update  10/18/2023 Time of Session: 10:40 AM [name] MRN: [id number]  Principal Diagnosis: Bipolar I disorder, most recent episode (or current) depressed, severe, specified as with psychotic behavior (HCC)  Secondary Diagnoses: Principal Problem:   Bipolar I disorder, most recent episode (or current) depressed, severe, specified as with psychotic behavior (HCC)   Current Medications:  Current Facility-Administered Medications  Medication Dose Route Frequency Provider Last Rate Last Admin   acetaminophen  (TYLENOL ) tablet 650 mg  650 mg Oral Q6H PRN Mannie Jerel PARAS, NP   650 mg at 10/17/23 1500   alum & mag hydroxide-simeth (MAALOX/MYLANTA) 200-200-20 MG/5ML suspension 30 mL  30 mL Oral Q4H PRN Mannie Jerel PARAS, NP       haloperidol  (HALDOL ) tablet 5 mg  5 mg Oral TID PRN Mannie Jerel PARAS, NP       And   diphenhydrAMINE  (BENADRYL ) capsule 50 mg  50 mg Oral TID PRN Mannie Jerel PARAS, NP       haloperidol  lactate (HALDOL ) injection 5 mg  5 mg Intramuscular TID PRN Mannie Jerel PARAS, NP       And   diphenhydrAMINE  (BENADRYL ) injection 50 mg  50 mg Intramuscular TID PRN Mannie Jerel PARAS, NP       And   LORazepam  (ATIVAN ) injection 2 mg  2 mg Intramuscular TID PRN Mannie Jerel PARAS, NP       haloperidol  lactate (HALDOL ) injection 10 mg  10 mg Intramuscular TID PRN Mannie Jerel PARAS, NP       And   diphenhydrAMINE  (BENADRYL ) injection 50 mg  50 mg Intramuscular TID PRN Mannie Jerel PARAS, NP       And   LORazepam  (ATIVAN ) injection 2 mg  2 mg Intramuscular TID PRN Mannie Jerel PARAS, NP       gabapentin  (NEURONTIN ) capsule 300 mg  300 mg Oral BID Mannie Jerel PARAS, NP   300 mg at 10/18/23 0749   magnesium  hydroxide (MILK OF MAGNESIA) suspension 30 mL  30 mL Oral Daily PRN Mannie Jerel PARAS, NP       naproxen  (NAPROSYN ) tablet 375 mg  375 mg Oral TID WC Nwoko, Agnes I, NP   375 mg at 10/18/23 0749   nicotine  (NICODERM CQ  - dosed in mg/24 hours)  patch 21 mg  21 mg Transdermal Q0600 Mannie Jerel PARAS, NP   21 mg at 10/18/23 0749   risperiDONE  (RISPERDAL ) tablet 1 mg  1 mg Oral BID Mannie Jerel PARAS, NP   1 mg at 10/18/23 0749   tiZANidine  (ZANAFLEX ) tablet 2 mg  2 mg Oral Q6H PRN Collene Gouge I, NP   2 mg at 10/18/23 0800   traZODone  (DESYREL ) tablet 100 mg  100 mg Oral QHS PRN Mannie Jerel PARAS, NP   100 mg at 10/17/23 9862   Vitamin D  (Ergocalciferol ) (DRISDOL ) 1.25 MG (50000 UNIT) capsule 50,000 Units  50,000 Units Oral Daily Parker, Alvin S, MD   50,000 Units at 10/18/23 9247   PTA Medications: Medications Prior to Admission  Medication Sig Dispense Refill Last Dose/Taking   aspirin  EC 325 MG EC tablet Take 1 tablet (325 mg total) by mouth 2 (two) times daily after a meal. (Patient taking differently: Take 325 mg by mouth daily as needed for mild pain (pain score 1-3) or moderate pain (pain score 4-6).) 30 tablet 0    gabapentin  (NEURONTIN ) 300 MG capsule Take 1 capsule (300 mg total) by mouth 3 (three) times daily. (Patient taking differently:  Take 300 mg by mouth 2 (two) times daily.) 90 capsule 0    meloxicam (MOBIC) 15 MG tablet Take 15 mg by mouth daily as needed for pain.      tiZANidine  (ZANAFLEX ) 2 MG tablet Take 1 tablet (2 mg total) by mouth every 6 (six) hours as needed. (Patient not taking: Reported on 10/16/2023) 60 tablet 0     Patient Stressors: Financial difficulties   Marital or family conflict    Patient Strengths: Supportive family/friends  Work skills   Treatment Modalities: Medication Management, Group therapy, Case management,  1 to 1 session with clinician, Psychoeducation, Recreational therapy.   Physician Treatment Plan for Primary Diagnosis: Bipolar I disorder, most recent episode (or current) depressed, severe, specified as with psychotic behavior (HCC) Long Term Goal(s): Improvement in symptoms so as ready for discharge   Short Term Goals: Ability to identify and develop effective coping behaviors will  improve Ability to maintain clinical measurements within normal limits will improve Compliance with prescribed medications will improve Ability to identify triggers associated with substance abuse/mental health issues will improve Ability to identify changes in lifestyle to reduce recurrence of condition will improve Ability to verbalize feelings will improve Ability to disclose and discuss suicidal ideas Ability to demonstrate self-control will improve  Medication Management: Evaluate patient's response, side effects, and tolerance of medication regimen.  Therapeutic Interventions: 1 to 1 sessions, Unit Group sessions and Medication administration.  Evaluation of Outcomes: Not Progressing  Physician Treatment Plan for Secondary Diagnosis: Principal Problem:   Bipolar I disorder, most recent episode (or current) depressed, severe, specified as with psychotic behavior (HCC)  Long Term Goal(s): Improvement in symptoms so as ready for discharge   Short Term Goals: Ability to identify and develop effective coping behaviors will improve Ability to maintain clinical measurements within normal limits will improve Compliance with prescribed medications will improve Ability to identify triggers associated with substance abuse/mental health issues will improve Ability to identify changes in lifestyle to reduce recurrence of condition will improve Ability to verbalize feelings will improve Ability to disclose and discuss suicidal ideas Ability to demonstrate self-control will improve     Medication Management: Evaluate patient's response, side effects, and tolerance of medication regimen.  Therapeutic Interventions: 1 to 1 sessions, Unit Group sessions and Medication administration.  Evaluation of Outcomes: Not Progressing   RN Treatment Plan for Primary Diagnosis: Bipolar I disorder, most recent episode (or current) depressed, severe, specified as with psychotic behavior (HCC) Long Term  Goal(s): Knowledge of disease and therapeutic regimen to maintain health will improve  Short Term Goals: Ability to remain free from injury will improve, Ability to verbalize frustration and anger appropriately will improve, Ability to demonstrate self-control, Ability to participate in decision making will improve, Ability to verbalize feelings will improve, Ability to disclose and discuss suicidal ideas, Ability to identify and develop effective coping behaviors will improve, and Compliance with prescribed medications will improve  Medication Management: RN will administer medications as ordered by provider, will assess and evaluate patient's response and provide education to patient for prescribed medication. RN will report any adverse and/or side effects to prescribing provider.  Therapeutic Interventions: 1 on 1 counseling sessions, Psychoeducation, Medication administration, Evaluate responses to treatment, Monitor vital signs and CBGs as ordered, Perform/monitor CIWA, COWS, AIMS and Fall Risk screenings as ordered, Perform wound care treatments as ordered.  Evaluation of Outcomes: Not Progressing   LCSW Treatment Plan for Primary Diagnosis: Bipolar I disorder, most recent episode (or current) depressed, severe, specified  as with psychotic behavior (HCC) Long Term Goal(s): Safe transition to appropriate next level of care at discharge, Engage patient in therapeutic group addressing interpersonal concerns.  Short Term Goals: Engage patient in aftercare planning with referrals and resources, Increase social support, Increase ability to appropriately verbalize feelings, Increase emotional regulation, Facilitate acceptance of mental health diagnosis and concerns, Facilitate patient progression through stages of change regarding substance use diagnoses and concerns, Identify triggers associated with mental health/substance abuse issues, and Increase skills for wellness and recovery  Therapeutic  Interventions: Assess for all discharge needs, 1 to 1 time with Social worker, Explore available resources and support systems, Assess for adequacy in community support network, Educate family and significant other(s) on suicide prevention, Complete Psychosocial Assessment, Interpersonal group therapy.  Evaluation of Outcomes: Not Progressing   Progress in Treatment: Attending groups: Yes. Participating in groups: Yes. Taking medication as prescribed: Yes. Toleration medication: Yes. Family/Significant other contact made: No, will contact:  [name] (husband) [phone] Patient understands diagnosis: Yes. Discussing patient identified problems/goals with staff: Yes. Medical problems stabilized or resolved: Yes. Denies suicidal/homicidal ideation: Yes. Issues/concerns per patient self-inventory: No.  New problem(s) identified:  No  New Short Term/Long Term Goal(s):   medication stabilization, elimination of SI thoughts, development of comprehensive mental wellness plan.    Patient Goals:  I want to work on my well-being and improve my ability to stay focused.  I like to set challenges for myself, but I also want to learn how to relax and not take everything too seriously, while still taking the important things seriously.  I want to go to therapy, and I know it helps to talk to someone.  Discharge Plan or Barriers:  Patient recently admitted. CSW will continue to follow and assess for appropriate referrals and possible discharge planning.   Reason for Continuation of Hospitalization: Mania Medication stabilization  Estimated Length of Stay:  5 - 7 days  Last 3 [place] Suicide Severity Risk Score: Flowsheet Row [facility] (Current[facility]27/2023 in [facility] Department [facility] ED from 10/04/2022 in [facility] at [facility]  C-SSRS RISK CATEGORY No  Risk No Risk No Risk    Last [facility] 2/9 Scores:     No data to display          Scribe for Treatment Team: Shelva Hetzer O Nasier Thumm, LCSWA 10/18/2023 12:44 PM

## 2023-10-18 NOTE — Group Note (Signed)
 Recreation Therapy Group Note   Group Topic:Leisure Education  Group Date: 10/18/2023 Start Time: 0935 End Time: 1011 Facilitators: Fitzhugh Vizcarrondo-McCall, LRT,CTRS Location: 300 Hall Dayroom   Group Topic: Leisure Education   Goal Area(s) Addresses:  Patient will successfully demonstrate knowledge of leisure and recreation interests. Patient will successfully identify benefits of leisure participation.  Patient will verbalize appropriate recreation activities to use post discharge.   Behavioral Response: Engaged   Intervention: Guess the Lyric   Activity: LRT facilitated a competitive group game that had patients guess the missing lyric to songs presented. Patients had 6 categories (Pop, Rock, R&B, Dance, Indie and Hip Hop) to choose from. Patient would spin the flicker and whatever category the spinner landed on, the patient would be read a line from that song. If they had the correct answer, they kept the card. If they made the wrong answer, everyone else got a chance to steal the point. The person with the most cards at the end, was the winner.   Education:  Leisure Programme researcher, broadcasting/film/video, Publishing copy Outcome: Acknowledges education   Affect/Mood: Appropriate   Participation Level: Engaged   Participation Quality: Independent   Behavior: Appropriate   Speech/Thought Process: Focused   Insight: Good   Judgement: Good   Modes of Intervention: Competitive Play   Patient Response to Interventions:  Engaged   Education Outcome:  In group clarification offered    Clinical Observations/Individualized Feedback: Pt was late coming to group but jumped right in with the activity once brought up to speed. Pt was focused and engaged throughout.      Plan: Continue to engage patient in RT group sessions 2-3x/week.   Aften Lipsey-McCall, LRT,CTRS 10/18/2023 12:26 PM

## 2023-10-19 NOTE — Group Note (Signed)
 Date:  10/19/2023 Time:  4:34 PM  Group Topic/Focus:  Self Care:   The focus of this group is to help patients understand the importance of self-care and sleep hygiene in order to improve or restore emotional, physical, spiritual, interpersonal, and financial health.    Participation Level:  Active  Participation Quality:  Appropriate, Attentive, and Sharing  Affect:  Appropriate  Cognitive:  Alert, Appropriate, and Oriented  Insight: Appropriate  Engagement in Group:  Engaged  Modes of Intervention:  Discussion   [name] 10/19/2023, 4:34 PM

## 2023-10-19 NOTE — Progress Notes (Signed)
 D: Patient is alert, oriented, pleasant, and cooperative. Denies SI, HI, AVH, and verbally contracts for safety. Patient reports she slept good last night without sleeping medication. Patient reports her appetite as good, energy level as normal, and concentration as good. Patient rates her depression 0/10, hopelessness 0/10, and anxiety 0/10. Patient reports low back pain.    A: Scheduled medications administered per MD order. PRN tizanidine  administered. Support provided. Patient educated on safety on the unit and medications. Routine safety checks every 15 minutes. Patient stated understanding to tell nurse about any new physical symptoms. Patient understands to tell staff of any needs.     R: No adverse drug reactions noted. Patient remains safe at this time and will continue to monitor.    10/19/23 0900  Psych Admission Type (Psych Patients Only)  Admission Status Involuntary  Psychosocial Assessment  Patient Complaints None  Eye Contact Fair  Facial Expression Animated  Affect Appropriate to circumstance  Speech Logical/coherent  Interaction Assertive  Motor Activity Slow  Appearance/Hygiene Unremarkable  Behavior Characteristics Cooperative;Appropriate to situation;Calm  Mood Pleasant  Thought Process  Coherency WDL  Content WDL  Delusions None reported or observed  Perception WDL  Hallucination None reported or observed  Judgment Poor  Confusion None  Danger to Self  Current suicidal ideation? Denies  Agreement Not to Harm Self Yes  Description of Agreement verbal  Danger to Others  Danger to Others None reported or observed

## 2023-10-19 NOTE — Progress Notes (Signed)
 [facility] MD Progress Note  10/19/2023 12:27 PM [name]  MRN:  [id number] Principal Problem: Bipolar I disorder, most recent episode (or current) depressed, severe, specified as with psychotic behavior (HCC) Diagnosis: Principal Problem:   Bipolar I disorder, most recent episode (or current) depressed, severe, specified as with psychotic behavior (HCC)   ID & Admission Information: [name] is an 43 y.o. female who  has a past medical history of Anemia (yrs ago), Anxiety (yrs ago), Arthritis, and Depression (yrs ago).  She presented on 10/16/2023  4:12 PM for Bipolar I disorder, most recent episode (or current) depressed, severe, specified as with psychotic behavior (HCC).  She presented for auditory and visual hallucinations in the context of medication noncompliance.  She was recently discharged from [facility].   Subjective:   Case was discussed in the multidisciplinary team. MAR was reviewed and patient was compliant with medications.  No acute events occurred overnight.   Melvinia was seen in her room during rounds.  She reported that her mood was good today.  She denies any new psychiatric or medical complaints.  She reports that her appetite is good.  She reports that focus and concentration are adequate.  She denies issues with energy.  She reports adequate sleep.  She denies any medication side effects.  She denies suicidal ideations, homicidal ideations, auditory hallucinations, visual hallucinations, or delusions.   Past Psychiatric and Medical Medical History:  Past Medical History:  Diagnosis Date   Anemia yrs ago   Anxiety yrs ago   Arthritis    oa   Depression yrs ago    Past Surgical History:  Procedure Laterality Date   TOTAL HIP ARTHROPLASTY Right 11/11/2017   Procedure: RIGHT TOTAL HIP ARTHROPLASTY ANTERIOR APPROACH;  Surgeon: Liam Lerner, MD;  Location: [facility];  Service: Orthopedics;  Laterality: Right;   TOTAL HIP ARTHROPLASTY Left 03/03/2018    Procedure: TOTAL HIP ARTHROPLASTY ANTERIOR APPROACH;  Surgeon: Sheril Coy, MD;  Location: [facility];  Service: Orthopedics;  Laterality: Left;   TUBAL LIGATION      Family History(Medical and Psychiatric): History reviewed. No pertinent family history.     Social History:  Social History   Substance and Sexual Activity  Alcohol Use Not Currently     Social History   Substance and Sexual Activity  Drug Use Yes   Types: Marijuana   Comment: Hx of THC use frequently    Social History   Socioeconomic History   Marital status: Married    Spouse name: Not on file   Number of children: Not on file   Years of education: Not on file   Highest education level: Not on file  Occupational History   Not on file  Tobacco Use   Smoking status: Every Day    Current packs/day: 0.25    Average packs/day: 0.3 packs/day for 24.0 years (6.0 ttl pk-yrs)    Types: Cigarettes   Smokeless tobacco: Never  Vaping Use   Vaping status: Never Used  Substance and Sexual Activity   Alcohol use: Not Currently   Drug use: Yes    Types: Marijuana    Comment: Hx of THC use frequently   Sexual activity: Not on file  Other Topics Concern   Not on file  Social History Narrative   Not on file   Social Drivers of Health   Financial Resource Strain: Low Risk  (04/20/2022)   Received [facility]   Overall Financial Resource Strain (CARDIA)    Difficulty of  Paying Living Expenses: Not hard at all  Food Insecurity: Food Insecurity Present (10/16/2023)   Hunger Vital Sign    Worried About Running Out of Food in the Last Year: Sometimes true    Ran Out of Food in the Last Year: Never true  Transportation Needs: No Transportation Needs (10/16/2023)   PRAPARE - Administrator, Civil Service (Medical): No    Lack of Transportation (Non-Medical): No  Physical Activity: Not on file  Stress: Not on file  Social Connections: Unknown (06/16/2021)   Received [facility]   Social  Network    Social Network: Not on file        Current Medications: Current Facility-Administered Medications  Medication Dose Route Frequency Provider Last Rate Last Admin   acetaminophen  (TYLENOL ) tablet 650 mg  650 mg Oral Q6H PRN Mannie Jerel PARAS, NP   650 mg at 10/19/23 0427   alum & mag hydroxide-simeth (MAALOX/MYLANTA) 200-200-20 MG/5ML suspension 30 mL  30 mL Oral Q4H PRN Mannie Jerel PARAS, NP       haloperidol  (HALDOL ) tablet 5 mg  5 mg Oral TID PRN Mannie Jerel PARAS, NP       And   diphenhydrAMINE  (BENADRYL ) capsule 50 mg  50 mg Oral TID PRN Mannie Jerel PARAS, NP       haloperidol  lactate (HALDOL ) injection 5 mg  5 mg Intramuscular TID PRN Mannie Jerel PARAS, NP       And   diphenhydrAMINE  (BENADRYL ) injection 50 mg  50 mg Intramuscular TID PRN Mannie Jerel PARAS, NP       And   LORazepam  (ATIVAN ) injection 2 mg  2 mg Intramuscular TID PRN Mannie Jerel PARAS, NP       haloperidol  lactate (HALDOL ) injection 10 mg  10 mg Intramuscular TID PRN Mannie Jerel PARAS, NP       And   diphenhydrAMINE  (BENADRYL ) injection 50 mg  50 mg Intramuscular TID PRN Mannie Jerel PARAS, NP       And   LORazepam  (ATIVAN ) injection 2 mg  2 mg Intramuscular TID PRN Mannie Jerel PARAS, NP       gabapentin  (NEURONTIN ) capsule 300 mg  300 mg Oral BID Mannie Jerel PARAS, NP   300 mg at 10/19/23 0755   magnesium  hydroxide (MILK OF MAGNESIA) suspension 30 mL  30 mL Oral Daily PRN Mannie Jerel PARAS, NP       naproxen  (NAPROSYN ) tablet 375 mg  375 mg Oral TID WC Nwoko, Agnes I, NP   375 mg at 10/19/23 0755   nicotine  (NICODERM CQ  - dosed in mg/24 hours) patch 21 mg  21 mg Transdermal Q0600 Mannie Jerel PARAS, NP   21 mg at 10/19/23 0753   risperiDONE  (RISPERDAL ) tablet 1 mg  1 mg Oral BID Mannie Jerel PARAS, NP   1 mg at 10/19/23 9244   tiZANidine  (ZANAFLEX ) tablet 2 mg  2 mg Oral Q6H PRN Collene Gouge I, NP   2 mg at 10/19/23 0755   traZODone  (DESYREL ) tablet 100 mg  100 mg Oral QHS PRN Mannie Jerel PARAS, NP   100 mg at 10/17/23  9862   Vitamin D  (Ergocalciferol ) (DRISDOL ) 1.25 MG (50000 UNIT) capsule 50,000 Units  50,000 Units Oral Daily Yudit Modesitt S, MD   50,000 Units at 10/19/23 9244    Lab Results:  Results for orders placed or performed during the hospital encounter of 10/16/23 (from the past 48 hours)  Folate     Status: None  Collection Time: 10/17/23  6:21 PM  Result Value Ref Range   Folate 8.2 >5.9 ng/mL    Comment: HEMOLYSIS AT THIS LEVEL MAY AFFECT RESULT Performed at [facility], [address] [place], [place] [address]   RPR     Status: None   Collection Time: 10/17/23  6:21 PM  Result Value Ref Range   RPR Ser Ql NON REACTIVE NON REACTIVE    Comment: Performed at [facility], [address] [place], [place] [address]  TSH     Status: Abnormal   Collection Time: 10/17/23  6:21 PM  Result Value Ref Range   TSH 0.250 (L) 0.350 - 4.500 uIU/mL    Comment: Performed at [facility], [address] [place], [place] [address]  Vitamin B12     Status: None   Collection Time: 10/17/23  6:21 PM  Result Value Ref Range   Vitamin B-12 481 180 - 914 pg/mL    Comment: Performed at [facility], [address] [place], [place] [address]  VITAMIN D  25 Hydroxy (Vit-D Deficiency, Fractures)     Status: Abnormal   Collection Time: 10/17/23  6:21 PM  Result Value Ref Range   Vit D, 25-Hydroxy 22.69 (L) 30 - 100 ng/mL    Comment: (NOTE) Vitamin D  deficiency has been defined by the Institute of Medicine  and an Endocrine Society practice guideline as a level of serum 25-OH  vitamin D  less than 20 ng/mL (1,2). The Endocrine Society went on to  further define vitamin D  insufficiency as a level between 21 and 29  ng/mL (2).  1. IOM (Institute of Medicine). 2010. Dietary reference intakes for  calcium and D. [place]: [employer]. 2. Holick MF, [place], Bischoff-Ferrari HA, et al. Evaluation,  treatment, and  prevention of vitamin D  deficiency: an Endocrine  Society clinical practice guideline, JCEM. 2011 Jul; 96(7): 1911-30.  Performed at [facility], [address], [place], [place] [address]   HIV Antibody (routine testing w rflx)     Status: None   Collection Time: 10/17/23  6:21 PM  Result Value Ref Range   HIV Screen 4th Generation wRfx Non Reactive Non Reactive    Comment: Performed at [facility], [address], [place], [place] [address]  T4, free     Status: Abnormal   Collection Time: 10/18/23  6:38 PM  Result Value Ref Range   Free T4 1.13 (H) 0.61 - 1.12 ng/dL    Comment: (NOTE) Biotin ingestion may interfere with free T4 tests. If the results are inconsistent with the TSH level, previous test results, or the clinical presentation, then consider biotin interference. If needed, order repeat testing after stopping biotin. Performed at [facility], [address], [place], [place] [address]     Blood Alcohol level:  Lab Results  Component Value Date   [facility] <15 10/16/2023   ETH <10 10/03/2022    Metabolic Disorder Labs: Lab Results  Component Value Date   HGBA1C 4.3 (L) 10/17/2023   MPG 76.71 10/17/2023   No results found for: PROLACTIN Lab Results  Component Value Date   CHOL 119 10/17/2023   TRIG 68 10/17/2023   HDL 56 10/17/2023   CHOLHDL 2.1 10/17/2023   VLDL 14 10/17/2023   LDLCALC 49 10/17/2023    Physical Findings: AIMS:  , ,  ,  ,    CIWA:    COWS:  Psychiatric Specialty Exam:  Presentation  General Appearance: Appropriate for Environment  Eye Contact: Good  Speech: Clear and Coherent; Normal Rate  Speech Volume: Normal  Handedness: Right   Mood and Affect  Mood: Euthymic  Affect: Congruent   Thought Process  Thought Processes: Linear  Descriptions of Associations: Intact  Orientation: Full (Time, Place and Person)  Thought Content: Logical  History of Schizophrenia/Schizoaffective disorder: No  data recorded Duration of Psychotic Symptoms: NA Hallucinations: Hallucinations: None  Ideas of Reference: None  Suicidal Thoughts: Suicidal Thoughts: No  Homicidal Thoughts: Homicidal Thoughts: No   Sensorium  Memory: Immediate Good  Judgment: Fair  Insight: Fair   Art therapist  Concentration: Good  Attention Span: Good  Recall: Good  Fund of Knowledge: Good  Language: Good   Psychomotor Activity  Psychomotor Activity: Psychomotor Activity: Normal   Assets  Assets: Communication Skills; Social Support; Housing; Leisure Time   Sleep  Sleep: Sleep: Good   Musculoskeletal: Strength & Muscle Tone: within normal limits Gait & Station: normal Patient leans: N/A   Physical Exam: General: Sitting comfortably. NAD. HEENT: Normocephalic, atraumatic, MMM, EMOI Lungs: no increased work of breathing noted Heart: no cyanosis Abdomen: Non distended Musculoskeletal: FROM. No obvious deformities Skin: Warm, dry, intact. No rashes noted Neuro: No obvious focal deficits.  Gait and station are normal  Review of Systems  Constitutional: Negative.   HENT: Negative.    Eyes: Negative.   Respiratory: Negative.    Cardiovascular: Negative.   Gastrointestinal: Negative.   Genitourinary: Negative.   Skin: Negative.   Neurological: Negative.   Psychiatric/Behavioral:  Positive for inappropriately bright affect.     Blood pressure (!) 129/98, pulse 99, temperature 98.1 F (36.7 C), temperature source Oral, resp. rate 16, height 5' 3 (1.6 m), weight 87 kg, SpO2 100%. Body mass index is 33.98 kg/m.  ASSESSMENT: [name] is an 43 y.o. female who  has a past medical history of Anemia (yrs ago), Anxiety (yrs ago), Arthritis, and Depression (yrs ago).  She presented on 10/16/2023  4:12 PM for Bipolar I disorder, most recent episode (or current) depressed, severe, specified as with psychotic behavior (HCC).  She presented for auditory and visual  hallucinations.  Diagnoses / Active Problems: Patient Active Problem List   Diagnosis Date Noted   Bipolar I disorder, most recent episode (or current) depressed, severe, specified as with psychotic behavior (HCC) 10/16/2023   Primary osteoarthritis of left hip 03/03/2018   Primary osteoarthritis of right hip 11/11/2017   Osteoarthritis of left hip 11/08/2017      PLAN: Safety and Monitoring:  -- Involuntary admission to inpatient psychiatric unit for safety, stabilization and treatment  -- Daily contact with patient to assess and evaluate symptoms and progress in treatment  -- Patient's case to be discussed in multi-disciplinary team meeting  -- Observation Level : q15 minute checks  -- Vital signs:  q12 hours  -- Precautions: suicide, elopement, and assault  2. Psychiatric Diagnoses and Treatment:  Patient Active Problem List   Diagnosis Date Noted   Bipolar I disorder, most recent episode (or current) depressed, severe, specified as with psychotic behavior (HCC) 10/16/2023   Primary osteoarthritis of left hip 03/03/2018   Primary osteoarthritis of right hip 11/11/2017   Osteoarthritis of left hip 11/08/2017     Scheduled Medications:  gabapentin   300 mg Oral BID   naproxen   375 mg Oral TID WC   nicotine   21 mg Transdermal Q0600   risperiDONE   1 mg Oral BID  Vitamin D  (Ergocalciferol )  50,000 Units Oral Daily    As Needed Medications: acetaminophen , alum & mag hydroxide-simeth, haloperidol  **AND** diphenhydrAMINE , haloperidol  lactate **AND** diphenhydrAMINE  **AND** LORazepam , haloperidol  lactate **AND** diphenhydrAMINE  **AND** LORazepam , magnesium  hydroxide, tiZANidine , traZODone     3. Medical Issues Being Addressed:   -- No significant medical issues  Labs reviewed, unremarkable    Tobacco Use Disorder  -- Nicotine  replacement as above  -- Smoking cessation encouraged  4. Discharge Planning:   -- Social work and case management to assist with discharge planning  and identification of hospital follow-up needs prior to discharge  -- Estimated LOS: Likely discharge early next week  -- Discharge Concerns: Need to establish a safety plan; Medication compliance and effectiveness  -- Discharge Goals: Return home with outpatient referrals for mental health follow-up including medication management/psychotherapy  5. Short Term Goals:  Improve ability to identify changes in lifestyle to reduce recurrence of condition, verbalize feelings, disclose and discuss suicidal ideas, demonstrate self-control, identify and develop effective coping behaviors, compliance with prescribed medications, identify triggers associated with substance abuse/mental health issues, participate in unit milieu and in scheduled group therapies   6. Long Term Goals: Improvement in symptoms so the patient is ready for discharge   --The risks/benefits/side-effects/alternatives to the medications above were discussed in detail with the patient and time was given for questions. The patient provided informed consent.   -- Metabolic profile and EKG monitoring obtained while on an atypical antipsychotic and listed in the EHR    Total Time Spent in Direct Patient Care:  I personally spent 35 minutes on the unit in direct patient care. The direct patient care time included face-to-face time with the patient, reviewing the patient's chart, communicating with other professionals, and coordinating care. Greater than 50% of this time was spent in counseling or coordinating care with the patient regarding goals of hospitalization, psycho-education, and discharge planning needs.      Glendia Kitty, MD Psychiatrist  10/19/2023, 12:27 PM   I certify that inpatient services furnished can reasonably be expected to improve the patient's condition.    Portions of this note were created using voice recognition software. Minor syntax errors, grammatical content, spelling, or punctuation errors may have  occurred unintentionally. Please notify the dino if the meaning of any statement is unclear.

## 2023-10-19 NOTE — Plan of Care (Signed)

## 2023-10-19 NOTE — [facility] Suicide Risk Assessment (Signed)
 [facility] INPATIENT:  Family/Significant Other Suicide Prevention Education  Suicide Prevention Education:  Contact Attempts: Patrick Gasser, husband, has been identified by the patient as the family member/significant other with whom the patient will be residing, and identified as the person(s) who will aid the patient in the event of a mental health crisis.  With written consent from the patient, two attempts were made to provide suicide prevention education, prior to and/or following the patient's discharge.  We were unsuccessful in providing suicide prevention education.  A suicide education pamphlet was given to the patient to share with family/significant other.  Date and time of first attempt: 10/19/23, 1550 Date and time of second attempt:  [name] 10/19/2023, 3:51 PM

## 2023-10-19 NOTE — [facility] Group Notes (Signed)
 LCSW Wellness Group Note   10/19/2023 11:00am  Type of Group and Topic: Psychoeducational Group:  Wellness  Participation Level:  Active  Description of Group  Wellness group introduces the topic and its focus on developing healthy habits across the spectrum and its relationship to a decrease in hospital admissions.  Six areas of wellness are discussed: physical, social spiritual, intellectual, occupational, and emotional.  Patients are asked to consider their current wellness habits and to identify areas of wellness where they are interested and able to focus on improvements.    Therapeutic Goals Patients will understand components of wellness and how they can positively impact overall health.  Patients will identify areas of wellness where they have developed good habits. Patients will identify areas of wellness where they would like to make improvements.    Summary of Patient Progress: pt very active and engaged in the group discussion, made appropriate comments, did some appropriate sharing regarding grief and loss issues she continues to struggle with.  Pt identified financial and social as wellness areas of strength and emotional as wellness area that needs improvement.       Therapeutic Modalities: Cognitive Behavioral Therapy Psychoeducation    Bridget Cordella Simmonds, LCSW

## 2023-10-19 NOTE — Group Note (Signed)
 Date:  10/19/2023 Time:  11:18 AM  Group Topic/Focus:  Safety Plan This group focused on helping patients identify personal safety goals and strategies to support emotional and physical well-being. Patients discussed the meaning of safety, explored warning signs, coping skills, and sources of support, and were encouraged to share individualized safety goals. The group emphasized self-awareness, personal strengths, and the importance of proactive planning in maintaining safety and supporting recovery.    Participation Level:  Active  Participation Quality:  Appropriate and Attentive  Affect:  Appropriate  Cognitive:  Alert and Appropriate  Insight: Appropriate and Improving  Engagement in Group:  Engaged, Improving, and Supportive  Modes of Intervention:  Discussion, Education, Exploration, Socialization, and Support  Additional Comments:  Pt attended and actively participated in this safety plan group.  Kristi HERO Lillian Tigges 10/19/2023, 11:18 AM

## 2023-10-19 NOTE — Progress Notes (Signed)
    10/19/23 0106  Psych Admission Type (Psych Patients Only)  Admission Status Involuntary  Psychosocial Assessment  Patient Complaints None  Eye Contact Fair  Facial Expression Animated  Affect Appropriate to circumstance  Speech Logical/coherent  Interaction Assertive  Motor Activity Slow  Appearance/Hygiene Unremarkable  Behavior Characteristics Cooperative;Appropriate to situation  Mood Pleasant  Thought Process  Coherency WDL  Content WDL  Delusions None reported or observed  Perception WDL  Hallucination None reported or observed  Judgment Poor  Confusion None  Danger to Self  Current suicidal ideation? Denies  Agreement Not to Harm Self Yes  Description of Agreement Verbal  Danger to Others  Danger to Others None reported or observed

## 2023-10-19 NOTE — Group Note (Signed)
 Date:  10/19/2023 Time:  10:00 AM  Group Topic/Focus:  Goals Group:   The focus of this group is to help patients establish daily goals to achieve during treatment and discuss how the patient can incorporate goal setting into their daily lives to aide in recovery.    Participation Level:  Active  Participation Quality:  Appropriate, Attentive, Sharing, and Supportive  Affect:  Appropriate  Cognitive:  Alert and Appropriate  Insight: Appropriate and Improving  Engagement in Group:  Engaged, Improving, and Supportive  Modes of Intervention:  Discussion, Exploration, and Socialization  Additional Comments:  Pt attended and participated in goals group. She was respectful toward her peers. She shares her goals are practice relaxation thoughts, stop anxiety and reduce stress. She expresses to achieve these goals she will have motivation and is willing to find interactions of that aspect that will reduce that level of stress. She also shares she can use crosswords, coloring, etc. to work toward her goals.  Kristi HERO Noe Pittsley 10/19/2023, 10:00 AM

## 2023-10-19 NOTE — Plan of Care (Addendum)
 Patient was calm and cooperative throughout the shift. She denied suicidal ideation,self injurious behaviors, homicidal ideation, and auditory or visual hallucinations. she endorsed experiencing back pain which she stated was a baseline. The patient actively participated in the evening wrap-up group and was observed interacting appropriately with peers. She reported that her mood was "good," which was congruent with her calm affect. She stated that her goal for the night was to get good sleep. The patient was compliant with all prescribed medications and was later observed sleeping in her room. At approximately 0300, she reported back pain rated 7/10 and was administered PRN Tylenol  with good effect. No unsafe behaviors were observed during the shift. Safety checks were completed every 15 minutes as per protocol.  Problem: Education: Goal: Knowledge of [place] [facility] Education information/materials will improve Outcome: Progressing Goal: Emotional status will improve Outcome: Progressing Goal: Mental status will improve Outcome: Progressing Goal: Verbalization of understanding the information provided will improve Outcome: Progressing   Problem: Activity: Goal: Interest or engagement in activities will improve Outcome: Progressing Goal: Sleeping patterns will improve Outcome: Progressing   Problem: Coping: Goal: Ability to verbalize frustrations and anger appropriately will improve Outcome: Progressing Goal: Ability to demonstrate self-control will improve Outcome: Progressing   Problem: Health Behavior/Discharge Planning: Goal: Identification of resources available to assist in meeting health care needs will improve Outcome: Progressing Goal: Compliance with treatment plan for underlying cause of condition will improve Outcome: Progressing   Problem: Physical Regulation: Goal: Ability to maintain clinical measurements within normal limits will improve Outcome: Progressing    Problem: Safety: Goal: Periods of time without injury will increase Outcome: Progressing

## 2023-10-20 ENCOUNTER — Encounter (HOSPITAL_COMMUNITY): Payer: Self-pay | Admitting: Psychiatry

## 2023-10-20 DIAGNOSIS — E0590 Thyrotoxicosis, unspecified without thyrotoxic crisis or storm: Secondary | ICD-10-CM

## 2023-10-20 HISTORY — DX: Thyrotoxicosis, unspecified without thyrotoxic crisis or storm: E05.90

## 2023-10-20 NOTE — Progress Notes (Signed)
 D: Patient is alert, oriented, pleasant, and cooperative. Denies SI, HI, AVH, and verbally contracts for safety. Patient reports she slept fair last night with sleeping medication. Patient reports her appetite as good, energy level as normal, and concentration as good. Patient rates her depression 0/10, hopelessness 0/10, and anxiety 0/10. Patient reports low back pain.    A: Scheduled medications administered per MD order. Support provided. Patient educated on safety on the unit and medications. Routine safety checks every 15 minutes. Patient stated understanding to tell nurse about any new physical symptoms. Patient understands to tell staff of any needs.     R: No adverse drug reactions noted. Patient remains safe at this time and will continue to monitor.    10/20/23 0900  Psych Admission Type (Psych Patients Only)  Admission Status Involuntary  Psychosocial Assessment  Patient Complaints None  Eye Contact Fair  Facial Expression Animated  Affect Appropriate to circumstance  Speech Logical/coherent  Interaction Assertive  Motor Activity Slow  Appearance/Hygiene Unremarkable  Behavior Characteristics Cooperative;Calm  Mood Pleasant  Thought Process  Coherency WDL  Content WDL  Delusions None reported or observed  Perception WDL  Hallucination None reported or observed  Judgment Limited  Confusion None  Danger to Self  Current suicidal ideation? Denies  Agreement Not to Harm Self Yes  Description of Agreement verbal  Danger to Others  Danger to Others None reported or observed

## 2023-10-20 NOTE — Group Note (Signed)
 Date:  10/20/2023 Time:  11:39 AM  Group Topic/Focus:  Goals Group:   The focus of this group is to help patients establish daily goals to achieve during treatment and discuss how the patient can incorporate goal setting into their daily lives to aide in recovery.    Participation Level:  Active  Participation Quality:  Appropriate  Affect:  Appropriate  Cognitive:  Appropriate  Insight: Appropriate  Engagement in Group:  Improving  Modes of Intervention:  Discussion and Education  Additional Comments:  Pt attended Group  Nalea Salce E Calden Dorsey 10/20/2023, 11:39 AM

## 2023-10-20 NOTE — Plan of Care (Signed)
   Problem: Education: Goal: Knowledge of [facility] Education information/materials will improve Outcome: Progressing   Problem: Activity: Goal: Interest or engagement in activities will improve Outcome: Progressing   Problem: Coping: Goal: Ability to verbalize frustrations and anger appropriately will improve Outcome: Progressing   Problem: Safety: Goal: Periods of time without injury will increase Outcome: Progressing

## 2023-10-20 NOTE — Group Note (Signed)
 Date:  10/20/2023 Time:  8:57 PM  Group Topic/Focus:  Wrap-Up Group:   The focus of this group is to help patients review their daily goal of treatment and discuss progress on daily workbooks.    Participation Level:  Active  Participation Quality:  Appropriate  Affect:  Appropriate  Cognitive:  Appropriate  Insight: Appropriate  Engagement in Group:  Engaged  Modes of Intervention:  Discussion  Additional Comments:   Pt states he's been socializing more often today. Pt was engaged in recreation activities and programming. Pt expressed readiness to leave tomorrow, denies everything  Shaddai Shapley A Leroy Pettway 10/20/2023, 8:57 PM

## 2023-10-20 NOTE — Progress Notes (Signed)
 [facility] MD Progress Note  10/20/2023 2:24 PM [name]  MRN:  [id number] Principal Problem: Bipolar I disorder, most recent episode (or current) depressed, severe, specified as with psychotic behavior (HCC) Diagnosis: Principal Problem:   Bipolar I disorder, most recent episode (or current) depressed, severe, specified as with psychotic behavior (HCC)   ID & Admission Information: [name] is an 43 y.o. female who  has a past medical history of Anemia (yrs ago), Anxiety (yrs ago), Arthritis, Depression (yrs ago), and Hyperthyroidism (10/20/2023).  She presented on 10/16/2023  4:12 PM for Bipolar I disorder, most recent episode (or current) depressed, severe, specified as with psychotic behavior (HCC).  She presented for auditory and visual hallucinations in the context of medication noncompliance.  She was recently discharged from [facility].   Subjective:   Case was discussed in the multidisciplinary team. MAR was reviewed and patient was compliant with medications.  No acute events occurred overnight.   [name] was seen in her room during rounds.  She reported that her mood was good today.  She denies any new psychiatric or medical complaints.  She reports that her appetite is good.  She reports that focus and concentration are adequate.  She denies issues with energy.  She reports adequate sleep.  She denies any medication side effects.  She denies suicidal ideations, homicidal ideations, auditory hallucinations, visual hallucinations, or delusions.  In my opinion, she is appropriate for discharge on Monday, but that would be a decision for her doctor as I will not be on shift tomorrow.   Past Psychiatric and Medical Medical History:  Past Medical History:  Diagnosis Date   Anemia yrs ago   Anxiety yrs ago   Arthritis    oa   Depression yrs ago   Hyperthyroidism 10/20/2023    Past Surgical History:  Procedure Laterality Date   TOTAL HIP ARTHROPLASTY Right  11/11/2017   Procedure: RIGHT TOTAL HIP ARTHROPLASTY ANTERIOR APPROACH;  Surgeon: Liam Lerner, MD;  Location: [facility];  Service: Orthopedics;  Laterality: Right;   TOTAL HIP ARTHROPLASTY Left 03/03/2018   Procedure: TOTAL HIP ARTHROPLASTY ANTERIOR APPROACH;  Surgeon: Sheril Coy, MD;  Location: [facility];  Service: Orthopedics;  Laterality: Left;   TUBAL LIGATION      Family History(Medical and Psychiatric): History reviewed. No pertinent family history.     Social History:  Social History   Substance and Sexual Activity  Alcohol Use Not Currently     Social History   Substance and Sexual Activity  Drug Use Yes   Types: Marijuana   Comment: Hx of THC use frequently    Social History   Socioeconomic History   Marital status: Married    Spouse name: Not on file   Number of children: Not on file   Years of education: Not on file   Highest education level: Not on file  Occupational History   Not on file  Tobacco Use   Smoking status: Every Day    Current packs/day: 0.25    Average packs/day: 0.3 packs/day for 24.0 years (6.0 ttl pk-yrs)    Types: Cigarettes   Smokeless tobacco: Never  Vaping Use   Vaping status: Never Used  Substance and Sexual Activity   Alcohol use: Not Currently   Drug use: Yes    Types: Marijuana    Comment: Hx of THC use frequently   Sexual activity: Not on file  Other Topics Concern   Not on file  Social History Narrative   Not on  file   Social Drivers of Health   Financial Resource Strain: Low Risk  (04/20/2022)   Received from [facility]   Overall Financial Resource Strain (CARDIA)    Difficulty of Paying Living Expenses: Not hard at all  Food Insecurity: Food Insecurity Present (10/16/2023)   Hunger Vital Sign    Worried About Running Out of Food in the Last Year: Sometimes true    Ran Out of Food in the Last Year: Never true  Transportation Needs: No Transportation Needs (10/16/2023)   PRAPARE - Scientist, research (physical sciences) (Medical): No    Lack of Transportation (Non-Medical): No  Physical Activity: Not on file  Stress: Not on file  Social Connections: Unknown (06/16/2021)   Received from [facility]   Social Network    Social Network: Not on file        Current Medications: Current Facility-Administered Medications  Medication Dose Route Frequency Provider Last Rate Last Admin   acetaminophen  (TYLENOL ) tablet 650 mg  650 mg Oral Q6H PRN Mannie Jerel PARAS, NP   650 mg at 10/19/23 2125   alum & mag hydroxide-simeth (MAALOX/MYLANTA) 200-200-20 MG/5ML suspension 30 mL  30 mL Oral Q4H PRN Mannie Jerel PARAS, NP       haloperidol  (HALDOL ) tablet 5 mg  5 mg Oral TID PRN Mannie Jerel PARAS, NP       And   diphenhydrAMINE  (BENADRYL ) capsule 50 mg  50 mg Oral TID PRN Mannie Jerel PARAS, NP       haloperidol  lactate (HALDOL ) injection 5 mg  5 mg Intramuscular TID PRN Mannie Jerel PARAS, NP       And   diphenhydrAMINE  (BENADRYL ) injection 50 mg  50 mg Intramuscular TID PRN Mannie Jerel PARAS, NP       And   LORazepam  (ATIVAN ) injection 2 mg  2 mg Intramuscular TID PRN Mannie Jerel PARAS, NP       haloperidol  lactate (HALDOL ) injection 10 mg  10 mg Intramuscular TID PRN Mannie Jerel PARAS, NP       And   diphenhydrAMINE  (BENADRYL ) injection 50 mg  50 mg Intramuscular TID PRN Mannie Jerel PARAS, NP       And   LORazepam  (ATIVAN ) injection 2 mg  2 mg Intramuscular TID PRN Mannie Jerel PARAS, NP       gabapentin  (NEURONTIN ) capsule 300 mg  300 mg Oral BID Mannie Jerel PARAS, NP   300 mg at 10/20/23 0749   magnesium  hydroxide (MILK OF MAGNESIA) suspension 30 mL  30 mL Oral Daily PRN Mannie Jerel PARAS, NP       naproxen  (NAPROSYN ) tablet 375 mg  375 mg Oral TID WC Nwoko, Mac I, NP   375 mg at 10/20/23 1150   nicotine  (NICODERM CQ  - dosed in mg/24 hours) patch 21 mg  21 mg Transdermal Q0600 Mannie Jerel PARAS, NP   21 mg at 10/20/23 0630   risperiDONE  (RISPERDAL ) tablet 1 mg  1 mg Oral BID Mannie Jerel PARAS, NP   1 mg at  10/20/23 9250   tiZANidine  (ZANAFLEX ) tablet 2 mg  2 mg Oral Q6H PRN Collene Mac I, NP   2 mg at 10/19/23 2127   traZODone  (DESYREL ) tablet 100 mg  100 mg Oral QHS PRN Mannie Jerel PARAS, NP   100 mg at 10/19/23 2124   Vitamin D  (Ergocalciferol ) (DRISDOL ) 1.25 MG (50000 UNIT) capsule 50,000 Units  50,000 Units Oral Daily Oneika Simonian S, MD   50,000 Units at  10/20/23 0749    Lab Results:  Results for orders placed or performed during the hospital encounter of 10/16/23 (from the past 48 hours)  T4, free     Status: Abnormal   Collection Time: 10/18/23  6:38 PM  Result Value Ref Range   Free T4 1.13 (H) 0.61 - 1.12 ng/dL    Comment: (NOTE) Biotin ingestion may interfere with free T4 tests. If the results are inconsistent with the TSH level, previous test results, or the clinical presentation, then consider biotin interference. If needed, order repeat testing after stopping biotin. Performed at [facility], [address] [place], [place] [address]     Blood Alcohol level:  Lab Results  Component Value Date   [facility] <15 10/16/2023   ETH <10 10/03/2022    Metabolic Disorder Labs: Lab Results  Component Value Date   HGBA1C 4.3 (L) 10/17/2023   MPG 76.71 10/17/2023   No results found for: PROLACTIN Lab Results  Component Value Date   CHOL 119 10/17/2023   TRIG 68 10/17/2023   HDL 56 10/17/2023   CHOLHDL 2.1 10/17/2023   VLDL 14 10/17/2023   LDLCALC 49 10/17/2023    Physical Findings: AIMS:  , ,  ,  ,    CIWA:    COWS:     Psychiatric Specialty Exam:  Presentation  General Appearance: Appropriate for Environment  Eye Contact: Good  Speech: Clear and Coherent; Normal Rate  Speech Volume: Normal  Handedness: Right   Mood and Affect  Mood: Euthymic  Affect: Congruent   Thought Process  Thought Processes: Linear  Descriptions of Associations: Intact  Orientation: Full (Time, Place and Person)  Thought Content: Logical  History of  Schizophrenia/Schizoaffective disorder: No data recorded Duration of Psychotic Symptoms: NA Hallucinations: Hallucinations: None  Ideas of Reference: None  Suicidal Thoughts: Suicidal Thoughts: No  Homicidal Thoughts: Homicidal Thoughts: No   Sensorium  Memory: Immediate Good  Judgment: Fair  Insight: Fair   Art therapist  Concentration: Good  Attention Span: Good  Recall: Good  Fund of Knowledge: Good  Language: Good   Psychomotor Activity  Psychomotor Activity: Psychomotor Activity: Normal   Assets  Assets: Communication Skills; Social Support; Housing; Leisure Time   Sleep  Sleep: Sleep: Good   Musculoskeletal: Strength & Muscle Tone: within normal limits Gait & Station: normal Patient leans: N/A   Physical Exam: General: Sitting comfortably. NAD. HEENT: Normocephalic, atraumatic, MMM, EMOI Lungs: no increased work of breathing noted Heart: no cyanosis Abdomen: Non distended Musculoskeletal: FROM. No obvious deformities Skin: Warm, dry, intact. No rashes noted Neuro: No obvious focal deficits.  Gait and station are normal  Review of Systems  Constitutional: Negative.   HENT: Negative.    Eyes: Negative.   Respiratory: Negative.    Cardiovascular: Negative.   Gastrointestinal: Negative.   Genitourinary: Negative.   Skin: Negative.   Neurological: Negative.   Psychiatric/Behavioral:  Positive for inappropriately bright affect.     Blood pressure 139/88, pulse 95, temperature 98 F (36.7 C), temperature source Oral, resp. rate 16, height 5' 3 (1.6 m), weight 87 kg, SpO2 100%. Body mass index is 33.98 kg/m.  ASSESSMENT: [name] is an 43 y.o. female who  has a past medical history of Anemia (yrs ago), Anxiety (yrs ago), Arthritis, Depression (yrs ago), and Hyperthyroidism (10/20/2023).  She presented on 10/16/2023  4:12 PM for Bipolar I disorder, most recent episode (or current) depressed, severe, specified as with psychotic  behavior (HCC).  She presented for  auditory and visual hallucinations.  Diagnoses / Active Problems: Patient Active Problem List   Diagnosis Date Noted   Bipolar I disorder, most recent episode (or current) depressed, severe, specified as with psychotic behavior (HCC) 10/16/2023   Primary osteoarthritis of left hip 03/03/2018   Primary osteoarthritis of right hip 11/11/2017   Osteoarthritis of left hip 11/08/2017      PLAN: Safety and Monitoring:  -- Involuntary admission to inpatient psychiatric unit for safety, stabilization and treatment  -- Daily contact with patient to assess and evaluate symptoms and progress in treatment  -- Patient's case to be discussed in multi-disciplinary team meeting  -- Observation Level : q15 minute checks  -- Vital signs:  q12 hours  -- Precautions: suicide, elopement, and assault  2. Psychiatric Diagnoses and Treatment:  Patient Active Problem List   Diagnosis Date Noted   Bipolar I disorder, most recent episode (or current) depressed, severe, specified as with psychotic behavior (HCC) 10/16/2023   Primary osteoarthritis of left hip 03/03/2018   Primary osteoarthritis of right hip 11/11/2017   Osteoarthritis of left hip 11/08/2017     Scheduled Medications:  gabapentin   300 mg Oral BID   naproxen   375 mg Oral TID WC   nicotine   21 mg Transdermal Q0600   risperiDONE   1 mg Oral BID   Vitamin D  (Ergocalciferol )  50,000 Units Oral Daily    As Needed Medications: acetaminophen , alum & mag hydroxide-simeth, haloperidol  **AND** diphenhydrAMINE , haloperidol  lactate **AND** diphenhydrAMINE  **AND** LORazepam , haloperidol  lactate **AND** diphenhydrAMINE  **AND** LORazepam , magnesium  hydroxide, tiZANidine , traZODone     3. Medical Issues Being Addressed:   -- No significant medical issues  Labs reviewed, unremarkable    Tobacco Use Disorder  -- Nicotine  replacement as above  -- Smoking cessation encouraged  4. Discharge Planning:   -- Social  work and case management to assist with discharge planning and identification of hospital follow-up needs prior to discharge  -- Estimated LOS: Likely discharge early next week  -- Discharge Concerns: Need to establish a safety plan; Medication compliance and effectiveness  -- Discharge Goals: Return home with outpatient referrals for mental health follow-up including medication management/psychotherapy  5. Short Term Goals:  Improve ability to identify changes in lifestyle to reduce recurrence of condition, verbalize feelings, disclose and discuss suicidal ideas, demonstrate self-control, identify and develop effective coping behaviors, compliance with prescribed medications, identify triggers associated with substance abuse/mental health issues, participate in unit milieu and in scheduled group therapies   6. Long Term Goals: Improvement in symptoms so the patient is ready for discharge   --The risks/benefits/side-effects/alternatives to the medications above were discussed in detail with the patient and time was given for questions. The patient provided informed consent.   -- Metabolic profile and EKG monitoring obtained while on an atypical antipsychotic and listed in the EHR    Total Time Spent in Direct Patient Care:  I personally spent 35 minutes on the unit in direct patient care. The direct patient care time included face-to-face time with the patient, reviewing the patient's chart, communicating with other professionals, and coordinating care. Greater than 50% of this time was spent in counseling or coordinating care with the patient regarding goals of hospitalization, psycho-education, and discharge planning needs.      Glendia Kitty, MD Psychiatrist  10/20/2023, 2:24 PM   I certify that inpatient services furnished can reasonably be expected to improve the patient's condition.    Portions of this note were created using voice recognition software. Minor syntax errors,  grammatical content, spelling, or punctuation errors may have occurred unintentionally. Please notify the dino if the meaning of any statement is unclear.

## 2023-10-20 NOTE — Progress Notes (Signed)
(  Sleep Hours) -8.25 (Any PRNs that were needed, meds refused, or side effects to meds)- Tylenol , Zanaflex , Trazodone  (Any disturbances and when (visitation, over night)- none (Concerns raised by the patient)- none (SI/HI/AVH)- Denies

## 2023-10-20 NOTE — Progress Notes (Signed)
   10/20/23 2050  Psych Admission Type (Psych Patients Only)  Admission Status Involuntary  Psychosocial Assessment  Patient Complaints None  Eye Contact Fair  Facial Expression Flat;Masked  Affect Preoccupied  Speech Soft  Interaction Cautious;Guarded  Motor Activity Other (Comment) (WNL)  Appearance/Hygiene Unremarkable  Behavior Characteristics Guarded  Mood Preoccupied  Thought Process  Coherency WDL  Content WDL  Delusions None reported or observed  Perception WDL  Hallucination None reported or observed  Judgment Limited  Confusion None  Danger to Self  Current suicidal ideation?  (Currently Denies)  Agreement Not to Harm Self Yes  Description of Agreement Notify Staff  Danger to Others  Danger to Others None reported or observed

## 2023-10-20 NOTE — Plan of Care (Signed)

## 2023-10-20 NOTE — Group Note (Signed)
 Date:  10/20/2023 Time:  5:38 PM  Group Topic/Focus:  Coping With Mental Health Crisis:   The purpose of this group is to help patients identify strategies for coping with mental health crisis.  Group discusses possible causes of crisis and ways to manage them effectively. Identifying Needs:   The focus of this group is to help patients identify their personal needs that have been historically problematic and identify healthy behaviors to address their needs. Wellness Toolbox:   The focus of this group is to discuss various aspects of wellness, balancing those aspects and exploring ways to increase the ability to experience wellness.  Patients will create a wellness toolbox for use upon discharge.    Participation Level:  Active  Participation Quality:  Appropriate  Affect:  Appropriate  Cognitive:  Appropriate  Insight: Appropriate  Engagement in Group:  Engaged  Modes of Intervention:  Discussion  Additional Comments:    [name] [place] 10/20/2023, 5:38 PM

## 2023-10-21 DIAGNOSIS — F315 Bipolar disorder, current episode depressed, severe, with psychotic features: Principal | ICD-10-CM

## 2023-10-21 LAB — T3: T3, Total: 113 ng/dL (ref 71–180)

## 2023-10-21 MED ORDER — VITAMIN D (ERGOCALCIFEROL) 1.25 MG (50000 UNIT) PO CAPS: 50000.0000 [IU] | ORAL_CAPSULE | Freq: Every day | ORAL | 0 refills | Status: AC

## 2023-10-21 MED ORDER — GABAPENTIN 300 MG PO CAPS: 300.0000 mg | ORAL_CAPSULE | Freq: Two times a day (BID) | ORAL | 0 refills | Status: DC

## 2023-10-21 MED ORDER — RISPERIDONE 1 MG PO TABS: 1.0000 mg | ORAL_TABLET | Freq: Two times a day (BID) | ORAL | 0 refills | Status: DC

## 2023-10-21 MED ORDER — NICOTINE 21 MG/24HR TD PT24: 21.0000 mg | MEDICATED_PATCH | Freq: Every day | TRANSDERMAL | Status: AC

## 2023-10-21 MED ORDER — TRAZODONE HCL 100 MG PO TABS: 100.0000 mg | ORAL_TABLET | Freq: Every evening | ORAL | 0 refills | Status: DC | PRN

## 2023-10-21 MED ORDER — TIZANIDINE HCL 2 MG PO TABS: 2.0000 mg | ORAL_TABLET | Freq: Four times a day (QID) | ORAL | 0 refills | Status: DC | PRN

## 2023-10-21 MED ORDER — NAPROXEN 375 MG PO TABS
375.0000 mg | ORAL_TABLET | Freq: Three times a day (TID) | ORAL | 0 refills | Status: AC
  Filled 2023-12-17: qty 90, 30d supply, fill #0

## 2023-10-21 NOTE — Progress Notes (Signed)
  [facility] Adult Case Management Discharge Plan :  Will you be returning to the same living situation after discharge:  Yes,  pt will be returning home after discharge.  At discharge, do you have transportation home?: Yes,  pt will be picked up by family between 1PM & 1:15PM. Do you have the ability to pay for your medications: Yes,  pt reported being employed and receives financial support from family.   Release of information consent forms completed and in the chart;  Patient's signature needed at discharge.  Patient to Follow up at:  Follow-up Information     [facility] Schedule an appointment as soon as possible for a visit.   Why: You have a hospital follow up appointment for therapy and medication management services on    .  The appointment will be held in person. Contact information: [address] [place] [address] [phone]                 Next level of care provider has access to [facility] Link:no  Safety Planning and Suicide Prevention discussed: Yes,  completed with Solstice Lastinger (husband) [phone]     Has patient been referred to the Quitline?: Yes, faxed/e-referral on 10/21/23  Patient has been referred for addiction treatment: No known substance use disorder.  Leza Apsey M Bernece Gall, LCSWA 10/21/2023, 10:57 AM

## 2023-10-21 NOTE — Group Note (Signed)
 Recreation Therapy Group Note   Group Topic:Communication  Group Date: 10/21/2023 Start Time: 0930 End Time: 1000 Facilitators: Azreal Stthomas-McCall, LRT,CTRS Location: [address] Day[address]   Group Topic: Communication, Team Building, Problem Solving  Goal Area(s) Addresses:  Patient will effectively work with peer towards shared goal.  Patient will identify skills used to make activity successful.  Patient will identify how skills used during activity can be applied to reach post d/c goals.   Behavioral Response: Engaged  Intervention: STEM Activity- Glass blower/designer  Activity: Tallest [employer]. In teams of 5-6, patients were given 11 craft pipe cleaners. Using the materials provided, patients were instructed to compete again the opposing team(s) to build the tallest free-standing structure from floor level. The activity was timed; difficulty increased by Clinical research associate as Production designer, theatre/television/film continued.  Systematically resources were removed with additional directions for example, placing one arm behind their back, working in silence, and shape stipulations. LRT facilitated post-activity discussion reviewing team processes and necessary communication skills involved in completion. Patients were encouraged to reflect how the skills utilized, or not utilized, in this activity can be incorporated to positively impact support systems post discharge.  Education: Pharmacist, community, Scientist, physiological, Discharge Planning   Education Outcome: Acknowledges education/In group clarification offered/Needs additional education.    Affect/Mood: Appropriate   Participation Level: Engaged   Participation Quality: Independent   Behavior: Appropriate   Speech/Thought Process: Focused   Insight: Good   Judgement: Good   Modes of Intervention: STEM Activity   Patient Response to Interventions:  Engaged   Education Outcome:  In group clarification offered    Clinical  Observations/Individualized Feedback: Pt was bright and engaged. Pt worked with peers in coming up with and creating their structure.      Plan: Continue to engage patient in RT group sessions 2-3x/week.   Taquana Bartley-McCall, LRT,CTRS 10/21/2023 11:44 AM

## 2023-10-21 NOTE — [facility] Counselor (Signed)
 Referral faxed to [facility] Quitline per patient request.  Megha Agnes, LCSWA 10/21/23 11:05am

## 2023-10-21 NOTE — BHH Suicide Risk Assessment (Signed)
 Suicide Risk Assessment  Discharge Assessment    [facility] Discharge Suicide Risk Assessment   Principal Problem: Bipolar I disorder, most recent episode (or current) depressed, severe, specified as with psychotic behavior (HCC) Discharge Diagnoses: Principal Problem:   Bipolar I disorder, most recent episode (or current) depressed, severe, specified as with psychotic behavior (HCC)   Total Time spent with patient: 30 minutes  Musculoskeletal: Strength & Muscle Tone: within normal limits Gait & Station: normal Patient leans: N/A  Psychiatric Specialty Exam  Presentation  General Appearance:  Casual; Neat  Eye Contact: Good  Speech: Clear and Coherent; Normal Rate  Speech Volume: Normal  Handedness: Right   Mood and Affect  Mood: Euthymic  Duration of Depression Symptoms: No data recorded Affect: Appropriate; Full Range   Thought Process  Thought Processes: Coherent; Linear  Descriptions of Associations:Intact  Orientation:Full (Time, Place and Person)  Thought Content:Logical  History of Schizophrenia/Schizoaffective disorder:No data recorded Duration of Psychotic Symptoms:No data recorded Hallucinations:Hallucinations: None  Ideas of Reference:None  Suicidal Thoughts:Suicidal Thoughts: No  Homicidal Thoughts:Homicidal Thoughts: No   Sensorium  Memory: Immediate Good; Recent Good; Remote Good  Judgment: Intact  Insight: Fair   Art therapist  Concentration: Good  Attention Span: Good  Recall: Good  Fund of Knowledge: Good  Language: Good   Psychomotor Activity  Psychomotor Activity: Psychomotor Activity: Normal   Assets  Assets: Communication Skills; Desire for Improvement; Housing; Intimacy; Resilience; Social Support   Sleep  Sleep: Sleep: Good  Estimated Sleeping Duration (Last 24 Hours): 4.50-6.25 hours  Physical Exam: Physical Exam ROS Blood pressure 132/86, pulse (!) 107, temperature 98 F (36.7  C), temperature source Oral, resp. rate 16, height 5' 3 (1.6 m), weight 87 kg, SpO2 100%. Body mass index is 33.98 kg/m.  Mental Status Per Nursing Assessment::   On Admission:  Suicidal ideation indicated by others  Demographic Factors:  NA  Loss Factors: NA  Historical Factors: Anniversary of important loss  Risk Reduction Factors:   Sense of responsibility to family, Religious beliefs about death, Living with another person, especially a relative, Positive social support, Positive therapeutic relationship, and Positive coping skills or problem solving skills  Continued Clinical Symptoms:  More than one psychiatric diagnosis Previous Psychiatric Diagnoses and Treatments Medical Diagnoses and Treatments/Surgeries  Cognitive Features That Contribute To Risk:  None    Suicide Risk:  Minimal: No identifiable suicidal ideation.  Patients presenting with no risk factors but with morbid ruminations; may be classified as minimal risk based on the severity of the depressive symptoms.  Patient is future oriented and motivated towards recovery.  No overt psychotic symptoms.  No manic features.  Stable for lower level of care.   Follow-up Information     [facility] Schedule an appointment as soon as possible for a visit.   Why: You have a hospital follow up appointment for therapy and medication management services on    .  The appointment will be held in person. Contact information: [address] [place] [address] [phone]                 Plan Of Care/Follow-up recommendations:  See discharge summary.  Blair Chiquita Hint, NP 10/21/2023, 12:52 PM

## 2023-10-21 NOTE — Plan of Care (Signed)

## 2023-10-21 NOTE — [facility] Group Notes (Signed)
 The focus of this group is to help patients establish daily goals to achieve during treatment and discuss how the patient can incorporate goal setting into their daily lives to aide in recovery.            Scale 1-10 9    Goal: Discharging today

## 2023-10-21 NOTE — Discharge Summary (Signed)
 Physician Discharge Summary Note  Patient:  [name] is an 43 y.o., female MRN:  [id number] DOB:  Feb 17, 1980 Patient phone:  [phone] (home)  Patient address:   [place] [address] [place] [address],  Total Time spent with patient: 30 minutes  Date of Admission:  10/16/2023 Date of Discharge: 10/21/23   Reason for Admission:  This is the first psychiatric admission in this [facility] for this 30 year olf AA female with previous hx of bipolar disorder. Admitted to the [facility] from the [facility] hospital ED with complaint of auditory/visual hallucinations as reported by patient's family that (apparently, patient was hearing God & seeing a woman talking to her). Chart review reports also indicated that patient did tell family members that she wants to be gone & free from the world. Chart review reports also indicated that patient may have received treatments for mental health issues & has been non-compliant to her treatment regimen. Her UDS was positive for THC.    [name] is planned for discharge home where she resides with her husband.  She has been referred for counseling services. She is aware of follow-up plans and demonstrates insight into the importance of ongoing treatment and abstaining from psychoactive substances. No current safety concerns or acute psychiatric symptoms observed.       Principal Problem: Bipolar I disorder, most recent episode (or current) depressed, severe, specified as with psychotic behavior [facility]) Discharge Diagnoses: Principal Problem:   Bipolar I disorder, most recent episode (or current) depressed, severe, specified as with psychotic behavior (HCC)   Past Psychiatric History: See H&P  Past Medical History:  Past Medical History:  Diagnosis Date   Anemia yrs ago   Anxiety yrs ago   Arthritis    oa   Depression yrs ago   Hyperthyroidism 10/20/2023    Past Surgical History:  Procedure Laterality Date   TOTAL HIP ARTHROPLASTY Right  11/11/2017   Procedure: RIGHT TOTAL HIP ARTHROPLASTY ANTERIOR APPROACH;  Surgeon: Liam Lerner, MD;  Location: [facility];  Service: Orthopedics;  Laterality: Right;   TOTAL HIP ARTHROPLASTY Left 03/03/2018   Procedure: TOTAL HIP ARTHROPLASTY ANTERIOR APPROACH;  Surgeon: Sheril Coy, MD;  Location: [facility];  Service: Orthopedics;  Laterality: Left;   TUBAL LIGATION     Family History: History reviewed. No pertinent family history. Family Psychiatric  History: As mentioned above  Social History:  Social History   Substance and Sexual Activity  Alcohol Use Not Currently     Social History   Substance and Sexual Activity  Drug Use Yes   Types: Marijuana   Comment: Hx of THC use frequently    Social History   Socioeconomic History   Marital status: Married    Spouse name: Not on file   Number of children: Not on file   Years of education: Not on file   Highest education level: Not on file  Occupational History   Not on file  Tobacco Use   Smoking status: Every Day    Current packs/day: 0.25    Average packs/day: 0.3 packs/day for 24.0 years (6.0 ttl pk-yrs)    Types: Cigarettes   Smokeless tobacco: Never  Vaping Use   Vaping status: Never Used  Substance and Sexual Activity   Alcohol use: Not Currently   Drug use: Yes    Types: Marijuana    Comment: Hx of THC use frequently   Sexual activity: Not on file  Other Topics Concern   Not on file  Social History  Narrative   Not on file   Social Drivers of Health   Financial Resource Strain: Low Risk  (04/20/2022)   Received from [facility]   Overall Financial Resource Strain (CARDIA)    Difficulty of Paying Living Expenses: Not hard at all  Food Insecurity: Food Insecurity Present (10/16/2023)   Hunger Vital Sign    Worried About Running Out of Food in the Last Year: Sometimes true    Ran Out of Food in the Last Year: Never true  Transportation Needs: No Transportation Needs (10/16/2023)   PRAPARE - Therapist, art (Medical): No    Lack of Transportation (Non-Medical): No  Physical Activity: Not on file  Stress: Not on file  Social Connections: Unknown (06/16/2021)   Received from [facility]   Social Network    Social Network: Not on file    Hospital Course:  During the patient's hospitalization, patient had extensive initial psychiatric evaluation, and follow-up psychiatric evaluations every day.  Psychiatric diagnoses provided upon initial assessment:  Bipolar I disorder, most recent episode (or current) depressed, severe, specified as with psychotic behavior (HCC)  On admission, patient's psychiatric medications were continued. Gabapentin  300 mg twice daily was maintained for management of left hip osteoarthritis, with additional benefit for anxiety symptoms.  Risperidone  1 mg twice daily was also continued for treatment of psychosis, including auditory and visual hallucinations, as well as for mood stabilization.  Patient's care was discussed during the interdisciplinary team meeting every day during the hospitalization.  The patient denies having side effects to prescribed psychiatric medication.  Gradually, patient started adjusting to milieu. The patient was evaluated each day by a clinical provider to ascertain response to treatment. Improvement was noted by the patient's report of decreasing symptoms, improved sleep and appetite, affect, medication tolerance, behavior, and participation in unit programming.  Patient was asked each day to complete a self inventory noting mood, mental status, pain, new symptoms, anxiety and concerns.    Symptoms were reported as significantly decreased or resolved completely by discharge.   On day of discharge, the patient reports that their mood is stable. The patient denied having suicidal thoughts for more than 48 hours prior to discharge.  Patient denies having homicidal thoughts.  Patient denies having auditory hallucinations.   Patient denies any visual hallucinations or other symptoms of psychosis. The patient was motivated to continue taking medication with a goal of continued improvement in mental health.   The patient reports their target psychiatric symptoms of anxiety, psychosis and insomnia responded well to the psychiatric medications, and the patient reports overall benefit other psychiatric hospitalization. Supportive psychotherapy was provided to the patient. The patient also participated in regular group therapy while hospitalized. Coping skills, problem solving as well as relaxation therapies were also part of the unit programming.  Labs were reviewed with the patient, and abnormal results were discussed with the patient.  The patient is able to verbalize their individual safety plan to this provider.  # It is recommended to the patient to continue psychiatric medications as prescribed, after discharge from the hospital.    # It is recommended to the patient to follow up with your outpatient psychiatric provider and PCP.  # It was discussed with the patient, the impact of alcohol, drugs, tobacco have been there overall psychiatric and medical wellbeing, and total abstinence from substance use was recommended the patient.ed.  # Prescriptions provided or sent directly to preferred pharmacy at discharge. Patient agreeable to plan. Given  opportunity to ask questions. Appears to feel comfortable with discharge.    # In the event of worsening symptoms, the patient is instructed to call the crisis hotline, 911 and or go to the nearest ED for appropriate evaluation and treatment of symptoms. To follow-up with primary care provider for other medical issues, concerns and or health care needs  # Patient was discharged home with a plan to follow up as noted below.   Physical Findings: AIMS:  , , 0 ,  ,  ,  ,   CIWA:   N/A COWS:   N/A  Musculoskeletal: Strength & Muscle Tone: within normal limits Gait & Station:  normal Patient leans: N/A   Psychiatric Specialty Exam:  Presentation  General Appearance:  Casual; Neat  Eye Contact: Good  Speech: Clear and Coherent; Normal Rate  Speech Volume: Normal  Handedness: Right   Mood and Affect  Mood: Euthymic  Affect: Appropriate; Full Range   Thought Process  Thought Processes: Coherent; Linear  Descriptions of Associations:Intact  Orientation:Full (Time, Place and Person)  Thought Content:Logical  History of Schizophrenia/Schizoaffective disorder:No data recorded Duration of Psychotic Symptoms:No data recorded Hallucinations:Hallucinations: None  Ideas of Reference:None  Suicidal Thoughts:Suicidal Thoughts: No  Homicidal Thoughts:Homicidal Thoughts: No   Sensorium  Memory: Immediate Good; Recent Good; Remote Good  Judgment: Intact  Insight: Fair   Art therapist  Concentration: Good  Attention Span: Good  Recall: Good  Fund of Knowledge: Good  Language: Good   Psychomotor Activity  Psychomotor Activity: Psychomotor Activity: Normal   Assets  Assets: Communication Skills; Desire for Improvement; Housing; Intimacy; Resilience; Social Support   Sleep  Sleep: Sleep: Good  Estimated Sleeping Duration (Last 24 Hours): 4.50-6.25 hours   Physical Exam: Physical Exam Vitals and nursing note reviewed.  Constitutional:      General: She is not in acute distress.    Appearance: She is not ill-appearing.  HENT:     Mouth/Throat:     Pharynx: Oropharynx is clear.  Cardiovascular:     Rate and Rhythm: Tachycardia present.  Pulmonary:     Effort: No respiratory distress.  Skin:    General: Skin is dry.  Neurological:     General: No focal deficit present.     Mental Status: She is alert and oriented to person, place, and time.  Psychiatric:        Mood and Affect: Mood normal.        Behavior: Behavior normal.        Thought Content: Thought content normal.        Judgment:  Judgment normal.    Review of Systems  Psychiatric/Behavioral:  Positive for depression. Negative for hallucinations, memory loss and suicidal ideas. The patient is nervous/anxious and has insomnia.    Blood pressure 132/86, pulse (!) 107, temperature 98 F (36.7 C), temperature source Oral, resp. rate 16, height 5' 3 (1.6 m), weight 87 kg, SpO2 100%. Body mass index is 33.98 kg/m.   Social History   Tobacco Use  Smoking Status Every Day   Current packs/day: 0.25   Average packs/day: 0.3 packs/day for 24.0 years (6.0 ttl pk-yrs)   Types: Cigarettes  Smokeless Tobacco Never   Tobacco Cessation:  A prescription for an FDA-approved tobacco cessation medication provided at discharge   Blood Alcohol level:  Lab Results  Component Value Date   [facility] <15 10/16/2023   ETH <10 10/03/2022    Metabolic Disorder Labs:  Lab Results  Component Value Date   HGBA1C  4.3 (L) 10/17/2023   MPG 76.71 10/17/2023   No results found for: PROLACTIN Lab Results  Component Value Date   CHOL 119 10/17/2023   TRIG 68 10/17/2023   HDL 56 10/17/2023   CHOLHDL 2.1 10/17/2023   VLDL 14 10/17/2023   LDLCALC 49 10/17/2023    See Psychiatric Specialty Exam and Suicide Risk Assessment completed by Attending Physician prior to discharge.  Discharge destination:  Home  Is patient on multiple antipsychotic therapies at discharge:  No   Has Patient had three or more failed trials of antipsychotic monotherapy by history:  No  Recommended Plan for Multiple Antipsychotic Therapies: NA  Discharge Instructions     Activity as tolerated - No restrictions   Complete by: As directed    Diet - low sodium heart healthy   Complete by: As directed       Allergies as of 10/21/2023   No Known Allergies      Medication List     STOP taking these medications    aspirin  EC 325 MG tablet   meloxicam 15 MG tablet Commonly known as: MOBIC Replaced by: naproxen  375 MG tablet       TAKE these  medications      Indication  gabapentin  300 MG capsule Commonly known as: NEURONTIN  Take 1 capsule (300 mg total) by mouth 2 (two) times daily.  Indication: Neuropathic Pain   naproxen  375 MG tablet Commonly known as: NAPROSYN  Take 1 tablet (375 mg total) by mouth 3 (three) times daily with meals. Replaces: meloxicam 15 MG tablet  Indication: Arthritic pain   nicotine  21 mg/24hr patch Commonly known as: NICODERM CQ  - dosed in mg/24 hours Place 1 patch (21 mg total) onto the skin daily at 6 (six) AM. Start taking on: October 22, 2023  Indication: Nicotine  Addiction   risperiDONE  1 MG tablet Commonly known as: RISPERDAL  Take 1 tablet (1 mg total) by mouth 2 (two) times daily.  Indication: Mood stability   tiZANidine  2 MG tablet Commonly known as: ZANAFLEX  Take 1 tablet (2 mg total) by mouth every 6 (six) hours as needed.  Indication: Muscle Spasticity   traZODone  100 MG tablet Commonly known as: DESYREL  Take 1 tablet (100 mg total) by mouth at bedtime as needed for sleep.  Indication: Trouble Sleeping   Vitamin D  (Ergocalciferol ) 1.25 MG (50000 UNIT) Caps capsule Commonly known as: DRISDOL  Take 1 capsule (50,000 Units total) by mouth daily for 11 days.  Indication: Vitamin D  Deficiency        Follow-up Information     [facility] Schedule an appointment as soon as possible for a visit.   Why: You have a hospital follow up appointment for therapy and medication management services on    .  The appointment will be held in person. Contact information: [address] [place] [address] [phone]                 Follow-up recommendations:   Activity: as tolerated  Diet: heart healthy  Other: -Follow-up with your outpatient psychiatric provider -instructions on appointment date, time, and address (location) are provided to you in discharge paperwork.  -Take your psychiatric medications as prescribed at discharge - instructions  are provided to you in the discharge paperwork  -Follow-up with outpatient primary care doctor and other specialists -for management of preventative medicine and chronic medical disease: TSH  Vitamin D  deficiency   -Testing: Follow-up with outpatient provider for abnormal lab results: Low Vitamin  D level  Low TSH, Elevated T4 level   -If you are prescribed an atypical antipsychotic medication, we recommend that your outpatient psychiatrist follow routine screening for side effects within 3 months of discharge, including monitoring: AIMS scale, height, weight, blood pressure, fasting lipid panel, HbA1c, and fasting blood sugar.   -Recommend total abstinence from alcohol, tobacco, and other illicit drug use at discharge.   -If your psychiatric symptoms recur, worsen, or if you have side effects to your psychiatric medications, call your outpatient psychiatric provider, 911, [phone] or go to the nearest emergency department.  -If suicidal thoughts occur, immediately call your outpatient psychiatric provider, 911, [phone] or go to the nearest emergency department.    Signed: Blair Chiquita Hint, NP 10/21/2023, 12:40 PM

## 2023-10-21 NOTE — BH Assessment (Signed)
(  Sleep Hours) - 6 (Any PRNs that were needed, meds refused, or side effects to meds)- Trazodone  100 mg, Tizandidine 2 mg and Tylenol  650 mg PO (Any disturbances and when (visitation, over night)- None (Concerns raised by the patient)- Request to be D/C 10/21/23 (SI/HI/AVH)- Denies

## 2023-10-21 NOTE — Progress Notes (Signed)
 Spiritual care group on grief and loss facilitated by Chaplain Rockie Sofia, B[facility]  Group Goal: Support / Education around grief and loss  Members engage in facilitated group support and psycho-social education.  Group Description:  Following introductions and group rules, group members engaged in facilitated group dialogue and support around topic of loss, with particular support around experiences of loss in their lives. Group Identified types of loss (relationships / self / things) and identified patterns, circumstances, and changes that precipitate losses. Reflected on thoughts / feelings around loss, normalized grief responses, and recognized variety in grief experience. Group encouraged individual reflection on safe space and on the coping skills that they are already utilizing.  Group drew on Adlerian / Rogerian and narrative framework  Patient Progress: [name] attended group until her time of discharge.  She shared about her coping strategies, including her faith.

## 2023-10-21 NOTE — Progress Notes (Signed)
 Patient verbalizes readiness for discharge. All patient belongings returned to patient. Discharge instructions read and discussed with patient (appointments, medications, resources). Patient expressed gratitude for care provided. Patient discharged to lobby at 1340 where her husband was waiting.

## 2023-10-21 NOTE — Plan of Care (Signed)
  Problem: Education: Goal: Knowledge of [facility] Education information/materials will improve 10/21/2023 0224 by Claudene Bronwyn DASEN, RN Outcome: Progressing 10/21/2023 0224 by Claudene Bronwyn DASEN, RN Outcome: Progressing Goal: Emotional status will improve 10/21/2023 0224 by Claudene Bronwyn DASEN, RN Outcome: Progressing 10/21/2023 0224 by Claudene Bronwyn DASEN, RN Outcome: Progressing Goal: Mental status will improve 10/21/2023 0224 by Claudene Bronwyn DASEN, RN Outcome: Progressing 10/21/2023 0224 by Claudene Bronwyn DASEN, RN Outcome: Progressing Goal: Verbalization of understanding the information provided will improve 10/21/2023 0224 by Claudene Bronwyn DASEN, RN Outcome: Progressing 10/21/2023 0224 by Claudene Bronwyn DASEN, RN Outcome: Progressing

## 2023-10-21 NOTE — [facility] Suicide Risk Assessment (Signed)
 [facility] INPATIENT:  Family/Significant Other Suicide Prevention Education  Suicide Prevention Education:  Education Completed; [name] (husband) [phone],  (name of family member/significant other) has been identified by the patient as the family member/significant other with whom the patient will be residing, and identified as the person(s) who will aid the patient in the event of a mental health crisis (suicidal ideations/suicide attempt).  With written consent from the patient, the family member/significant other has been provided the following suicide prevention education, prior to the and/or following the discharge of the patient.  [name] confirmed patient will not have access to firearms/guns/weapons to harm herself or others. [name] reported being able to assist patient should she have a mental health crisis and willing to assist patient with safely storing and securing her medications. [name] denied having any safety concerns related to patient discharging home.   The suicide prevention education provided includes the following: Suicide risk factors Suicide prevention and interventions National Suicide Hotline telephone number [facility] assessment telephone number [facility] Emergency Assistance 911 [facility] and/or Residential Mobile Crisis Unit telephone number  Request made of family/significant other to: Remove weapons (e.g., guns, rifles, knives), all items previously/currently identified as safety concern.   Remove drugs/medications (over-the-counter, prescriptions, illicit drugs), all items previously/currently identified as a safety concern.  The family member/significant other verbalizes understanding of the suicide prevention education information provided.  The family member/significant other agrees to remove the items of safety concern listed above.  [name], LCSWA 10/21/2023, 10:51 AM

## 2023-11-08 ENCOUNTER — Other Ambulatory Visit: Payer: Self-pay

## 2023-11-08 ENCOUNTER — Ambulatory Visit (INDEPENDENT_AMBULATORY_CARE_PROVIDER_SITE_OTHER): Admitting: Physician Assistant

## 2023-11-08 ENCOUNTER — Encounter (HOSPITAL_COMMUNITY): Payer: Self-pay | Admitting: Physician Assistant

## 2023-11-08 ENCOUNTER — Other Ambulatory Visit (HOSPITAL_COMMUNITY): Payer: Self-pay

## 2023-11-08 VITALS — BP 157/100 | HR 73 | Temp 98.0°F | Ht 63.0 in | Wt 215.2 lb

## 2023-11-08 DIAGNOSIS — M1611 Unilateral primary osteoarthritis, right hip: Secondary | ICD-10-CM | POA: Diagnosis not present

## 2023-11-08 DIAGNOSIS — F315 Bipolar disorder, current episode depressed, severe, with psychotic features: Secondary | ICD-10-CM | POA: Diagnosis not present

## 2023-11-08 DIAGNOSIS — F411 Generalized anxiety disorder: Secondary | ICD-10-CM

## 2023-11-08 MED ORDER — HYDROXYZINE HCL 10 MG PO TABS
10.0000 mg | ORAL_TABLET | Freq: Three times a day (TID) | ORAL | 1 refills | Status: DC | PRN
  Filled 2023-11-08 (×2): qty 75, 25d supply, fill #0
  Filled 2023-12-02 (×2): qty 75, 25d supply, fill #1

## 2023-11-08 MED ORDER — RISPERIDONE 1 MG PO TABS
1.0000 mg | ORAL_TABLET | Freq: Two times a day (BID) | ORAL | 1 refills | Status: DC
  Filled 2023-11-08 – 2023-11-18 (×3): qty 60, 30d supply, fill #0
  Filled 2023-12-13 – 2023-12-17 (×3): qty 60, 30d supply, fill #1

## 2023-11-08 MED ORDER — TRAZODONE HCL 100 MG PO TABS
100.0000 mg | ORAL_TABLET | Freq: Every evening | ORAL | 1 refills | Status: AC | PRN
  Filled 2023-11-08 (×2): qty 30, 30d supply, fill #0
  Filled 2024-03-04: qty 30, 30d supply, fill #1

## 2023-11-08 MED ORDER — GABAPENTIN 300 MG PO CAPS
300.0000 mg | ORAL_CAPSULE | Freq: Two times a day (BID) | ORAL | 1 refills | Status: DC
  Filled 2023-11-08 – 2023-12-09 (×4): qty 60, 30d supply, fill #0

## 2023-11-08 MED ORDER — TIZANIDINE HCL 2 MG PO TABS
2.0000 mg | ORAL_TABLET | Freq: Four times a day (QID) | ORAL | 0 refills | Status: DC | PRN
  Filled 2023-11-08 – 2023-12-09 (×4): qty 60, 15d supply, fill #0
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
## 2023-11-18 ENCOUNTER — Other Ambulatory Visit: Payer: Self-pay

## 2023-11-18 MED ORDER — ERGOCALCIFEROL 1.25 MG (50000 UT) PO CAPS
50000.0000 [IU] | ORAL_CAPSULE | Freq: Every day | ORAL | 0 refills | Status: DC
  Filled 2023-12-17: qty 11, 11d supply, fill #0

## 2023-12-02 ENCOUNTER — Other Ambulatory Visit: Payer: Self-pay

## 2023-12-05 ENCOUNTER — Other Ambulatory Visit: Payer: Self-pay

## 2023-12-09 ENCOUNTER — Other Ambulatory Visit: Payer: Self-pay

## 2023-12-09 MED ORDER — GABAPENTIN 300 MG PO CAPS
300.0000 mg | ORAL_CAPSULE | Freq: Three times a day (TID) | ORAL | 1 refills | Status: AC
  Filled 2023-12-09: qty 270, 90d supply, fill #0

## 2023-12-09 MED ORDER — MELOXICAM 15 MG PO TABS
7.5000 mg | ORAL_TABLET | Freq: Every day | ORAL | 0 refills | Status: DC
  Filled 2024-01-07: qty 90, 90d supply, fill #0

## 2023-12-09 MED ORDER — TIZANIDINE HCL 2 MG PO TABS
2.0000 mg | ORAL_TABLET | Freq: Three times a day (TID) | ORAL | 0 refills | Status: AC | PRN
  Filled 2024-01-07: qty 90, 30d supply, fill #0

## 2023-12-09 MED ORDER — NYSTATIN 100000 UNIT/GM EX CREA: TOPICAL_CREAM | Freq: Two times a day (BID) | CUTANEOUS | 0 refills | Status: AC

## 2023-12-12 ENCOUNTER — Other Ambulatory Visit: Payer: Self-pay

## 2023-12-13 ENCOUNTER — Other Ambulatory Visit: Payer: Self-pay

## 2023-12-17 ENCOUNTER — Other Ambulatory Visit: Payer: Self-pay

## 2023-12-18 ENCOUNTER — Other Ambulatory Visit: Payer: Self-pay
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
## 2023-12-31 ENCOUNTER — Other Ambulatory Visit: Payer: Self-pay

## 2023-12-31 ENCOUNTER — Ambulatory Visit (INDEPENDENT_AMBULATORY_CARE_PROVIDER_SITE_OTHER): Payer: MEDICAID | Admitting: Psychiatry

## 2023-12-31 VITALS — BP 131/82 | Wt 224.0 lb

## 2023-12-31 DIAGNOSIS — F315 Bipolar disorder, current episode depressed, severe, with psychotic features: Secondary | ICD-10-CM | POA: Diagnosis not present

## 2023-12-31 DIAGNOSIS — F411 Generalized anxiety disorder: Secondary | ICD-10-CM | POA: Diagnosis not present

## 2023-12-31 MED ORDER — RISPERIDONE 1 MG PO TABS
ORAL_TABLET | ORAL | 1 refills | Status: DC
  Filled 2023-12-31: qty 60, 20d supply, fill #0

## 2023-12-31 MED ORDER — HYDROXYZINE HCL 25 MG PO TABS
25.0000 mg | ORAL_TABLET | Freq: Three times a day (TID) | ORAL | 1 refills | Status: DC | PRN
  Filled 2023-12-31: qty 90, 30d supply, fill #0
  Filled 2024-01-27: qty 90, 30d supply, fill #1

## 2023-12-31 MED ORDER — RISPERIDONE 1 MG PO TABS
ORAL_TABLET | ORAL | 1 refills | Status: DC
  Filled 2023-12-31 – 2024-01-14 (×2): qty 60, 20d supply, fill #0
  Filled 2024-02-03 (×2): qty 60, 20d supply, fill #1

## 2023-12-31 NOTE — Addendum Note (Signed)
 Addended by: IZELLA CARAWAY B on: 12/31/2023 04:49 PM   Modules accepted: Orders

## 2024-01-07 ENCOUNTER — Other Ambulatory Visit: Payer: Self-pay

## 2024-01-14 ENCOUNTER — Other Ambulatory Visit: Payer: Self-pay

## 2024-01-15 ENCOUNTER — Ambulatory Visit (INDEPENDENT_AMBULATORY_CARE_PROVIDER_SITE_OTHER): Payer: MEDICAID | Admitting: Mental Health

## 2024-01-15 ENCOUNTER — Other Ambulatory Visit: Payer: Self-pay

## 2024-01-15 DIAGNOSIS — F315 Bipolar disorder, current episode depressed, severe, with psychotic features: Secondary | ICD-10-CM | POA: Diagnosis not present

## 2024-01-15 DIAGNOSIS — F411 Generalized anxiety disorder: Secondary | ICD-10-CM

## 2024-01-17 ENCOUNTER — Other Ambulatory Visit: Payer: Self-pay

## 2024-01-17 MED ORDER — MELOXICAM 15 MG PO TABS
7.5000 mg | ORAL_TABLET | Freq: Every day | ORAL | 0 refills | Status: AC
  Filled 2024-01-17 – 2024-03-06 (×3): qty 90, 90d supply, fill #0

## 2024-01-17 MED ORDER — GABAPENTIN 300 MG PO CAPS
300.0000 mg | ORAL_CAPSULE | Freq: Three times a day (TID) | ORAL | 0 refills | Status: AC
  Filled 2024-03-03: qty 90, fill #0
  Filled 2024-03-09: qty 90, 30d supply, fill #0

## 2024-01-17 MED ORDER — TIZANIDINE HCL 2 MG PO TABS
2.0000 mg | ORAL_TABLET | Freq: Three times a day (TID) | ORAL | 0 refills | Status: DC
  Filled 2024-01-17 – 2024-01-27 (×2): qty 90, 30d supply, fill #0

## 2024-01-19 NOTE — Progress Notes (Signed)
 Comprehensive Clinical Assessment (CCA) Note  01/15/2024 [name] [id number]  Chief Complaint:  Chief Complaint  Patient presents with   Establish Care   Visit Diagnosis: Bipolar I disorder most recent episode depressed, GAD   CCA Screening, Triage and Referral (STR)  Patient Reported Information How did you hear about us ? Hospital Discharge  Whom do you see for routine medical problems? Primary Care  Practice/Facility Name: [facility]  What Is the Reason for Your Visit/Call Today?  I had a nervous break down in the beginning of Septmeber in which I was hospitalized  for a week.  How Long Has This Been Causing You Problems? > than 6 months  What Do You Feel Would Help You the Most Today? Treatment for Depression or other mood problem   Have You Recently Been in Any Inpatient Treatment (Hospital/Detox/Crisis Center/28-Day Program)? No  Have You Ever Received Services From [employer] Before? No  Have You Recently Had Any Thoughts About Hurting Yourself? No  Are You Planning to Commit Suicide/Harm Yourself At This time? No   Have you Recently Had Thoughts About Hurting Someone [place]? No  Have You Used Any Alcohol or Drugs in the Past 24 Hours? No  Do You Currently Have a Therapist/Psychiatrist? No  Have You Been Recently Discharged From Any Office Practice or Programs? No    CCA Screening Triage Referral Assessment Type of Contact: Face-to-Face  Collateral Involvement: Chart review  If Minor and Not Living with Parent(s), Who has Custody? Adult  Is CPS involved or ever been involved? Never  Is APS involved or ever been involved? Never   Patient Determined To Be At Risk for Harm To Self or Others Based on Review of Patient Reported Information or Presenting Complaint? No  Method: No Plan  Availability of Means: No access or NA  Intent: Vague intent or NA  Notification Required: No need or identified person  Additional Information for Danger  to Others Potential: No data recorded Additional Comments for Danger to Others Potential: NA  Are There Guns or Other Weapons in Your Home? No  Types of Guns/Weapons: NA  Are These Weapons Safely Secured?                            No (NA)  Who Could Verify You Are Able To Have These Secured: NA  Do You Have any Outstanding Charges, Pending Court Dates, Parole/Probation? Denies  Contacted To Inform of Risk of Harm To Self or Others: No data recorded  Location of Assessment: [facility]   Does Patient Present under Involuntary Commitment? No  IVC Papers Initial File Date: No data recorded  [place] of Residence: [place]   Patient Currently Receiving the Following Services: Medication Management   Determination of Need: Routine (7 days)   Options For Referral: Medication Management; Outpatient Therapy     CCA Biopsychosocial Intake/Chief Complaint:   I had a nervous break down in the beginning of Septmeber in which I was hospitalized for a week. [name] is a 43 year old married African-American female who presents for routine assessment to engage in outpatient therapy services, referred to [facility] OP following inpatient admsission in September of 2025; noting to have had a nervous break down. Shares hx of several nervous breakdowns, intially diagnosed with bipolar disorder in 2007. Currently engaed in medication managment services with [facility] OP, followed b [facility]. Chart reports diagnoses of bipolar I, most recent episode depressed with  psychotic features.  Shares hx of mental health concerns starting around the age of 31 following her brother being murdered. Shares brother's murder and mother's passing have been significant stressors for her in the past. Currently reports financial stressors. Shares can experience mood swings, feelings of less than and can isolate self. Notes presence of highs and lows. Hx of therapy in the past with [facility].  Current  Symptoms/Problems: mood swings, low moods, isolation, anixety   Patient Reported Schizophrenia/Schizoaffective Diagnosis in Past: No   Strengths: determined  Preferences: understand me.  Abilities: analyzing   Type of Services Patient Feels are Needed: my worrying   Initial Clinical Notes/Concerns: Bipolar disorder   Mental Health Symptoms Depression:  Tearfulness; Worthlessness; Hopelessness; Fatigue; Difficulty Concentrating; Sleep (too much or little) (hx of difficlty staying asleep, isolation, denies hx of self-harm behviors, denies hx of suicide attempts but shares hx of suicidal thoughts)   Duration of Depressive symptoms: Greater than two weeks   Mania:  Racing thoughts; Euphoria; Increased Energy; Recklessness; Change in energy/activity; Overconfidence (impulsve spending of money; difficulty sleeping)   Anxiety:   Worrying; Tension; Restlessness (hx of anxiety attacks- chest pains)   Psychosis:  Hallucinations (AH: hearing voices, hearing spirits talking VH: shadows)   Duration of Psychotic symptoms: Greater than six months   Trauma:  None (shares brother murdered- dreams of him being shot up)   Obsessions:  None   Compulsions:  None   Inattention:  None   Hyperactivity/Impulsivity:  None   Oppositional/Defiant Behaviors:  None   Emotional Irregularity:  None   Other Mood/Personality Symptoms:  No data recorded   Mental Status Exam Appearance and self-care  Stature:  Average   Weight:  Average weight   Clothing:  Casual   Grooming:  Normal   Cosmetic use:  None   Posture/gait:  Normal   Motor activity:  Not Remarkable   Sensorium  Attention:  Normal   Concentration:  Normal   Orientation:  X5   Recall/memory:  Normal   Affect and Mood  Affect:  Appropriate   Mood:  Dysphoric   Relating  Eye contact:  Normal   Facial expression:  Responsive   Attitude toward examiner:  Cooperative   Thought and Language  Speech flow:  Clear and Coherent   Thought content:  Appropriate to Mood and Circumstances   Preoccupation:  None   Hallucinations:  None   Organization:  No data recorded  [employer] of Knowledge:  Good   Intelligence:  Average   Abstraction:  Functional   Judgement:  Fair   Reality Testing:  Realistic   Insight:  Good   Decision Making:  Vacilates; Normal   Social Functioning  Social Maturity:  Responsible; Isolates   Social Judgement:  Normal   Stress  Stressors:  Surveyor, Quantity; Grief/losses; Work (mother and brother deceased;)   Coping Ability:  Human Resources Officer Deficits:  None   Supports:  Family; Friends/Service system     Religion: Religion/Spirituality Are You A Religious Person?: Yes What is Your Religious Affiliation?: Non-Denominational  Leisure/Recreation: Leisure / Recreation Do You Have Hobbies?: Yes Leisure and Hobbies: [place], going for walks with husband, watching T. V with husband  Exercise/Diet: Exercise/Diet Do You Exercise?: No Have You Gained or Lost A Significant Amount of Weight in the Past Six Months?: No Do You Follow a Special Diet?: No Do You Have Any Trouble Sleeping?: Yes Explanation of Sleeping Difficulties: difficuty remainginag sleep - medications are helpful  CCA Employment/Education Employment/Work Situation: Employment / Work Situation Employment Situation: Employed (full time) Where is Patient Currently Employed?: [employer] How Long has Patient Been Employed?: 3. 5 years Are You Satisfied With Your Job?: Yes Do You Work More Than One Job?: No Work Stressors: Engineer, Drilling Job has Been Impacted by Current Illness: Yes Describe how Patient's Job has Been Impacted: Shares stress at work can effect her mental health What is the Longest Time Patient has Held a Job?: 3.5 years Where was the Patient Employed at that Time?: current Has Patient ever Been in the 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 Did You Have An Individualized Education Program (IIEP): No Did You Have Any Difficulty At School?: No Patient's Education Has Been Impacted by Current Illness: No   CCA Family/Childhood History Family and Relationship History: Family history Marital status: Married Number of Years Married: 22 02-08-2002) What types of issues is patient dealing with in the relationship?: denies Are you sexually active?: Yes What is your sexual orientation?: heterosexual Has your sexual activity been affected by drugs, alcohol, medication, or emotional stress?: - Does patient have children?: Yes How many children?: 3 (25, 21, and 17) How is patient's relationship with their children?: 17 y.o daughter  touch and go. ; 62 y.o pretty good son; 17y.o daughter sometime static.  Childhood History:  Childhood History By whom was/is the patient raised?: Mother Additional childhood history information: Raised by her biological mother, raised in [place]. Describes childhood as sad Shares to have depressive nature in childhood, cried a lot for father. Shares to have been bullied, peer pressure from friends. Description of patient's relationship with caregiver when they were a child: Mother:  good   Father: no relationship Patient's description of current relationship with people who raised him/her: Mother: deceased  in Feb 08, 2018 20[birth year]- son's birthday) Father: deceased in 2015-02-09 Does patient have siblings?: Yes Number of Siblings: 4 (x 3 brothers; x (1 deceased); x 1 sister (deceased)) Description of  patient's current relationship with siblings: Distant relationships with siblings. Did patient suffer any verbal/emotional/physical/sexual abuse as a child?: No Did patient suffer from severe childhood neglect?: No Has patient ever been sexually abused/assaulted/raped as an adolescent or adult?: No Was the patient ever a victim of a crime or a disaster?: No Witnessed domestic violence?: No Has patient been affected by domestic violence as an adult?: No  Child/Adolescent Assessment:     CCA Substance Use Alcohol/Drug Use: Alcohol / Drug Use Prescriptions: See MAR History of alcohol / drug use?: Yes Substance #1 Name of Substance 1: Cigarette 1 - Age of First Use: 12 1 - Amount (size/oz): 7 to 8 a day 1 - Frequency: daily 1 - Duration: years 1 - Last Use / Amount: today 1 - Method of Aquiring: purchase 1- Route of Use: smoking Substance #2 Name of Substance 2: Alcohol 2 - Age of First Use: 12 2 - Amount (size/oz): one glass 2 - Frequency: every other month; on occasion 2 - Duration: years 2 - Last Use / Amount: 2 weeks ago 2 - Method of Aquiring:  purchase 2 - Route of Substance Use: drinking                     ASAM's:  Six Dimensions of Multidimensional Assessment  Dimension 1:  Acute Intoxication and/or Withdrawal Potential:      Dimension 2:  Biomedical Conditions and Complications:      Dimension 3:  Emotional, Behavioral, or Cognitive Conditions and Complications:     Dimension 4:  Readiness to Change:     Dimension 5:  Relapse, Continued use, or Continued Problem Potential:     Dimension 6:  Recovery/Living Environment:     ASAM Severity Score:    ASAM Recommended Level of Treatment: ASAM Recommended Level of Treatment: Level I Outpatient Treatment   Substance use Disorder (SUD)    Recommendations for Services/Supports/Treatments: Recommendations for Services/Supports/Treatments Recommendations For Services/Supports/Treatments: Individual Therapy,  Medication Management  DSM5 Diagnoses: Patient Active Problem List   Diagnosis Date Noted   Generalized anxiety disorder 11/08/2023   Bipolar I disorder, most recent episode (or current) depressed, severe, specified as with psychotic behavior (HCC) 10/16/2023   Primary osteoarthritis of left hip 03/03/2018   Primary osteoarthritis of right hip 11/11/2017   Osteoarthritis of left hip 11/08/2017   Summary:   [name] is a 43 year old married African-American female who presents for routine assessment to engage in outpatient therapy services, referred to [facility] OP following inpatient admsission in September of 2025; noting to have had a nervous break down. Shares hx of several nervous breakdowns, intially diagnosed with bipolar disorder in 2007. Currently engaed in medication managment services with [facility] OP, followed b [facility]. Chart reports diagnoses of bipolar I, most recent episode depressed with psychotic features. Shares hx of mental health concerns starting around the age of 37 following her brother being murdered. Shares brother's murder and mother's passing have been significant stressors for her in the past. Currently reports financial stressors. Shares can experience mood swings, feelings of less than and can isolate self. Notes presence of highs and lows. Hx of therapy in the past with [facility].   [name] presents for routine assessment alert and oriented; mood and affect low. Speech clear and coherent at normal rate and tone. Engaged and cooperative to assessment. Thought process logical; goal directed. Shares long history of menta heath concerns dating back to 2007. Endorses current sxs of depression to include crying spells, low mood, feelings of hopelessness, worthlessness, poor concentration, low energy with isolation from others. Notes hx of suicidal thoughts but denies hx of self-harm or suicide attempts. Notes hx of manic episodes occurring, noting racing thoughts, increased energy,  impulsivity with money and reduced need for sleep. Endorses anxiety with excessive worry, over thinking, tension with hx of anxiety attacks occurring. Presence of auditory hallucinations of voices and VH of seeing shadows. Denies hx of trauma events; denies trauma sxs Notes use of alcohol at rate of less than monthly and daily cigarette use. Currently engaged in work full time, denies legal concerns. Reports stable housing and supportive family. Denies current safety concerns   Ongoing criteria reported for Bipolar I disorder and GAD.   Agrees to OPT and currently engaged in medication management services.  Bounds and limits of confidentially reviewed.      01/15/2024   10:16 AM 11/08/2023    9:00 AM  GAD 7 : Generalized Anxiety Score  Nervous, Anxious, on Edge 2 2  Control/stop worrying 1 1  Worry too much - different things 1 2  Trouble  relaxing 1 2  Restless 1 1  Easily annoyed or irritable 1 2  Afraid - awful might happen 0 2  Total GAD 7 Score 7 12  Anxiety Difficulty Somewhat difficult Very difficult       01/15/2024   10:17 AM 11/08/2023    8:58 AM  Depression screen PHQ 2/9  Decreased Interest 2 2  Down, Depressed, Hopeless 3 2  PHQ - 2 Score 5 4  Altered sleeping 2 2  Tired, decreased energy 1 2  Change in appetite 0 1  Feeling bad or failure about yourself  2 2  Trouble concentrating 2 2  Moving slowly or fidgety/restless 0 1  Suicidal thoughts 0 1  PHQ-9 Score 12 15   Difficult doing work/chores Somewhat difficult Very difficult     Data saved with a previous flowsheet row definition     Patient Centered Plan: Patient is on the following Treatment Plan(s):  Anxiety and Depression   Referrals to Alternative Service(s): Referred to Alternative Service(s):   Place:   Date:   Time:    Referred to Alternative Service(s):   Place:   Date:   Time:    Referred to Alternative Service(s):   Place:   Date:   Time:    Referred to Alternative Service(s):   Place:    Date:   Time:      Collaboration of Care: Other None  Patient/Guardian was advised Release of Information must be obtained prior to any record release in order to collaborate their care with an outside provider. Patient/Guardian was advised if they have not already done so to contact the registration department to sign all necessary forms in order for us  to release information regarding their care.   Consent: Patient/Guardian gives verbal consent for treatment and assignment of benefits for services provided during this visit. Patient/Guardian expressed understanding and agreed to proceed.   [name], [facility]

## 2024-01-20 ENCOUNTER — Other Ambulatory Visit: Payer: Self-pay

## 2024-01-21 ENCOUNTER — Encounter (HOSPITAL_COMMUNITY): Payer: Self-pay

## 2024-01-27 ENCOUNTER — Other Ambulatory Visit: Payer: Self-pay

## 2024-02-03 ENCOUNTER — Other Ambulatory Visit (HOSPITAL_COMMUNITY): Payer: Self-pay

## 2024-02-03 ENCOUNTER — Other Ambulatory Visit: Payer: Self-pay

## 2024-02-05 ENCOUNTER — Other Ambulatory Visit: Payer: Self-pay

## 2024-02-10 ENCOUNTER — Ambulatory Visit (HOSPITAL_COMMUNITY): Payer: MEDICAID

## 2024-02-10 NOTE — Progress Notes (Addendum)
 BH MD Outpatient Progress Note  02/20/2024 10:38 AM [name]  MRN:  [id number]  Assessment:  [name] presents for follow-up evaluation in-person. In the prior visit, increase the Risperdal  dose for further optimization to aid with depressive symptoms. Today, patient is doing well in terms of her symptoms of depression and anxiety and felt that the increase in Risperdal  was helpful in aiding with the symptoms.  She is in the contemplative stage of stopping her cigarette use.  She finds support and her husband and is utilizing religion as well for her coping strategies.  She plans on continuing with her therapy sessions.  Aims exam completed today which was unremarkable.  No medication changes today, follow-up in 3 months.  Plan:  # Bipolar 1 disorder, current episode depressed Past medication trials:  -- Continue Risperdal  to 1 mg qam and 2 mg qpm  -- Labs up to date 10/2023  -- AIMS exam 02/20/2024 normal  # GAD Past medication trials:  -- Continue Gabapentin  300 mg BID -- Continue Atarax  25 mg TID PRN -- Therapy with [name]   # Insomnia -- Continue Trazodone  100 mg at bedtime PRN  Patient was given contact information for behavioral health clinic and was instructed to call 911 for emergencies.   Identifying Information: [name] is a 44 y.o. female with a history of bipolar 1 disorder who is an established patient with [facility] for management of depression.   Subjective:   Interval History:   Patient seen alone.  Patient reports feeling good today. Since the previous visit, she feels pretty good overall. She increased the Risperdal  and she felt this was helpful with anxiety and she denies having highs and lows and feels her mood is more even now. Patient reports the following adverse effects: denies.   She found out her medicaid is ending this month and she is worried. She notes talking to her husband and praying as her coping  strategies.   Patient reports good sleep, denying having to take her PRN sleep medication. Patient reports good appetite.   Patient denies current SI, HI, and AVH.   Substance use:  Tobacco: still smoking 7 cigarettes daily. She wants to stop and she smokes for her anxiety. Illicit substances: denies Alcohol: only on occasion   Past Psychiatric History:  Bipolar disorder diagnosed in 2008. Psych hospitalizations:  [facility], Most recent in [facility]  from 9/10-9/15 due to AVH and passive SI in the context of noncompliance from medications Previous psychiatrist at [facility], saw Dr. Curry in 2008 Therapist: yes previously but did not really pursue it due to medical benefits not covering  Hospitalizations: twice, most recently in 10/2023 and another time last year and both due to manic episodes Suicide attempts: denies SIB: states was hitting herself Access to weapons: denies  Hx of violence towards others: denies  Hx of trauma/abuse: emotional trauma- brother passed in 2004 which triggered mental breakdown and mother passed in 2019. 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 11 capsule 0   gabapentin  (NEURONTIN ) 300 MG capsule Take 1 capsule (300 mg total) by mouth 3 (three) times daily. 270 capsule 1   gabapentin  (NEURONTIN ) 300 MG capsule Take one capsule (300 mg dose) by mouth 3 (three) times a day. 90 capsule 0   hydrOXYzine  (ATARAX ) 25 MG tablet Take 1 tablet (25 mg total) by mouth 3 (three) times daily as needed. 90 tablet 1   meloxicam  (MOBIC ) 15 MG tablet Take one half tablet to one tablet (7.5-15 mg dose) by mouth daily. 90 tablet 0   naproxen  (NAPROSYN ) 375 MG tablet Take 1 tablet (375 mg total) by mouth 3 (three) times daily with meals. 90 tablet 0   nicotine  (NICODERM CQ  - DOSED IN MG/24 HOURS) 21 mg/24hr patch Place 1 patch (21 mg total) onto the skin daily at 6 (six) AM.     nystatin  cream (MYCOSTATIN ) Apply topically 2 (two) times daily. 30 g 0   risperiDONE  (RISPERDAL ) 1 MG tablet Take 1 tablet (1 mg total) by mouth daily with breakfast AND 2 tablets (2 mg total) at bedtime. 60 tablet 1   tiZANidine  (ZANAFLEX ) 2 MG tablet Take 1 tablet (2 mg total) by mouth every 8 (eight) hours as needed. 90 tablet 0   tiZANidine  (ZANAFLEX ) 2 MG tablet Take one tablet (2 mg dose) by mouth every 8 (eight) hours as needed. 90 tablet 0   traZODone  (DESYREL ) 100 MG tablet Take 1 tablet (100 mg total) by mouth at bedtime as needed for sleep. 30 tablet 1   No current facility-administered medications for this visit.    Objective:   Psychiatric Specialty Exam:  General Appearance: appears at  stated age, casually dressed and groomed   Behavior: pleasant and cooperative   Psychomotor Activity: no psychomotor agitation or retardation noted   Eye Contact: fair  Speech: normal amount, tone, volume and fluency    Mood: euthymic  Affect: congruent  Thought Process: linear, goal directed, no circumstantial or tangential thought process noted, no racing thoughts or flight of ideas  Descriptions of Associations: intact   Thought Content Hallucinations: denies AH, VH , does not appear responding to stimuli  Delusions: no paranoia, delusions of control, grandeur, ideas of reference, thought broadcasting, and magical thinking  Suicidal Thoughts: denies SI, intention, plan  Homicidal Thoughts: denies HI, intention, plan   Alertness/Orientation: alert and fully oriented   Insight: fair Judgment: fair  Memory: intact   Executive Functions  Concentration: intact  Attention Span: fair  Recall: intact  Fund of Knowledge: fair   Physical Exam  General: Pleasant, well-appearing. No acute distress. Pulmonary: Normal effort. No wheezing or rales. Skin: No obvious rash or lesions. Neuro: A&Ox3.No focal deficit.  Review of Systems  No reported symptoms  Metabolic Disorder Labs: Lab Results  Component Value Date   HGBA1C 4.3 (L) 10/17/2023   MPG 76.71 10/17/2023   No results found for: PROLACTIN Lab Results  Component Value Date   CHOL 119 10/17/2023   TRIG 68 10/17/2023   HDL 56 10/17/2023   CHOLHDL 2.1 10/17/2023   VLDL 14 10/17/2023   LDLCALC 49 10/17/2023   Lab Results  Component Value Date   TSH 0.250 (L) 10/17/2023    Therapeutic Level Labs: No results found for: LITHIUM No results found for: VALPROATE No results found for: CBMZ  Screenings:  AUDIT    Flowsheet Row [facility]  Alcohol Use Disorder Identification Test Final Score (AUDIT) 0   GAD-7    Flowsheet Row  Counselor from 01/15/2024 in [facility] Office Visit from 11/08/2023 in [facility] [address]  Total GAD-7 Score 7 12   PHQ2-9    Flowsheet Row Counselor from 01/15/2024 in [facility] Office Visit from 11/08/2023 in [facility]  PHQ-2 Total Score 5 4  PHQ-9 Total Score 12 15   Flowsheet Row Counselor from 01/15/2024 in [facility] Office Visit from 11/08/2023 in [facility] Ad[facility] (Discharged[facility]  C-SSRS RISK CATEGORY No Risk Low Risk No Risk    Collaboration of Care: Case discussed with attending, see attending's attestation for additional information.  Ismael Franco, MD PGY-3 Psychiatry Resident

## 2024-02-11 ENCOUNTER — Other Ambulatory Visit (HOSPITAL_COMMUNITY): Payer: Self-pay

## 2024-02-20 ENCOUNTER — Ambulatory Visit (INDEPENDENT_AMBULATORY_CARE_PROVIDER_SITE_OTHER): Payer: MEDICAID | Admitting: Psychiatry

## 2024-02-20 ENCOUNTER — Other Ambulatory Visit: Payer: Self-pay

## 2024-02-20 VITALS — BP 126/85 | Wt 234.0 lb

## 2024-02-20 DIAGNOSIS — F315 Bipolar disorder, current episode depressed, severe, with psychotic features: Secondary | ICD-10-CM

## 2024-02-20 DIAGNOSIS — F411 Generalized anxiety disorder: Secondary | ICD-10-CM

## 2024-02-20 MED ORDER — RISPERIDONE 1 MG PO TABS
ORAL_TABLET | ORAL | 0 refills | Status: AC
  Filled 2024-02-20: qty 90, 30d supply, fill #0

## 2024-02-20 MED ORDER — HYDROXYZINE HCL 25 MG PO TABS
25.0000 mg | ORAL_TABLET | Freq: Three times a day (TID) | ORAL | 2 refills | Status: AC | PRN
  Filled 2024-02-20 – 2024-02-24 (×2): qty 90, 30d supply, fill #0

## 2024-02-24 ENCOUNTER — Other Ambulatory Visit: Payer: Self-pay

## 2024-02-28 ENCOUNTER — Encounter (HOSPITAL_COMMUNITY): Payer: Self-pay

## 2024-02-28 ENCOUNTER — Ambulatory Visit (INDEPENDENT_AMBULATORY_CARE_PROVIDER_SITE_OTHER): Payer: MEDICAID

## 2024-02-28 DIAGNOSIS — F411 Generalized anxiety disorder: Secondary | ICD-10-CM

## 2024-02-28 NOTE — Progress Notes (Signed)
" ° °  THERAPIST PROGRESS NOTE  Virtual Visit via Video Note  I connected with [name] on 02/28/24 at 10:00 AM EST by a video enabled telemedicine application and verified that I am speaking with the correct person using two identifiers.  Location: Patient: Home Address Provider: Clinician Home Office   I discussed the limitations of evaluation and management by telemedicine and the availability of in person appointments. The patient expressed understanding and agreed to proceed.     I discussed the assessment and treatment plan with the patient. The patient was provided an opportunity to ask questions and all were answered. The patient agreed with the plan and demonstrated an understanding of the instructions.   The patient was advised to call back or seek an in-person evaluation if the symptoms worsen or if the condition fails to improve as anticipated.  I provided 58 minutes of non-face-to-face time during this encounter.   Othel Ada, [facility]   Session Time: 10:00am-10:58am  Participation Level: Active  Behavioral Response: NeatAlertEuthymic  Type of Therapy: Individual Therapy  Treatment Goals addressed:   LTG: [name] will score less than 5 on the Generalized Anxiety Disorder 5 Scale (GAD-7)   Interventions Review results of GAD-7 with [name] to track progress Frequency: Work with [name] to identify 3 personal goals for managing their anxiety to work on during current treatment.  Frequency: Create a weekly activity schedule  ProgressTowards Goals: Initial  Interventions: CBT  Summary: [name] is a 44 year old single female that presented today with diagnoses of Generalized anxiety disorder [F41.1] .  [name] reported having a stressful day at work earlier in the morning. Client was disrespectful and mean towards [name]. [name] reports pressure to do weill on the job and not taking things personal with clients. [name] expressed having  social anxiety but wanting to navigate her challenges. Also, [name] balances parenthood and providing independence for her children. [name] reports anxiety from work and parenting. [name] reflects on goals and discusses those goals.  [name]: None; without intent or plan.   Therapist Response: Clinician met with [name] today for in-person/ virtual therapy appointment and assessed for safety, medication compliance, and sobriety. [name] presented for session on time and was alert, oriented x5, with no evidence or self-report of active SI/HI. [name] denied any abuse of alcohol or illicit substances. [name] reported compliance with all medication. Clinician inquired about [name] current emotional ratings, as well as any significant changes in thoughts, feelings or behavior since previous check-in. [name] current symptoms have included  lbs, fatigue, irritability, and trouble sleeping with updated PHQ9 screening today rated 6.  [name] reported ongoing issues with anxiety such as difficulty concentrating, fatigue, and tension, rating a 2 on GAD7 screening. Therapist observes the similarity between daughter and clients and cogitinie distortions about being social.   Plan: Return again in 3 weeks.  Diagnosis: Generalized anxiety disorder [F41.1]   Collaboration of Care: current medication management  Patient/Guardian was advised Release of Information must be obtained prior to any record release in order to collaborate their care with an outside provider. Patient/Guardian was advised if they have not already done so to contact the registration department to sign all necessary forms in order for us  to release information regarding their care.   Consent: Patient/Guardian gives verbal consent for treatment and assignment of benefits for services provided during this visit. Patient/Guardian expressed understanding and agreed to proceed.   Othel Ada,  [name] 02/28/2024  "

## 2024-03-03 ENCOUNTER — Other Ambulatory Visit: Payer: Self-pay

## 2024-03-03 MED ORDER — TIZANIDINE HCL 2 MG PO TABS
2.0000 mg | ORAL_TABLET | Freq: Three times a day (TID) | ORAL | 0 refills | Status: AC
  Filled 2024-03-03: qty 90, 30d supply, fill #0

## 2024-03-04 ENCOUNTER — Other Ambulatory Visit: Payer: Self-pay

## 2024-03-05 ENCOUNTER — Other Ambulatory Visit: Payer: Self-pay

## 2024-03-06 ENCOUNTER — Other Ambulatory Visit: Payer: Self-pay

## 2024-03-09 ENCOUNTER — Other Ambulatory Visit: Payer: Self-pay

## 2024-03-20 ENCOUNTER — Ambulatory Visit (HOSPITAL_COMMUNITY): Payer: MEDICAID

## 2024-04-03 ENCOUNTER — Ambulatory Visit (HOSPITAL_COMMUNITY): Payer: MEDICAID

## 2024-05-19 ENCOUNTER — Encounter (HOSPITAL_COMMUNITY): Payer: MEDICAID | Admitting: Psychiatry

## 2024-05-22 ENCOUNTER — Encounter (HOSPITAL_COMMUNITY): Payer: MEDICAID | Admitting: Psychiatry
